# Patient Record
Sex: Female | Born: 1960 | Race: Black or African American | Hispanic: No | Marital: Single | Smoking: Never smoker
Health system: Southern US, Community
[De-identification: ages and names within clinical notes are randomized; demographics above are authoritative.]

## PROBLEM LIST (undated history)

## (undated) ENCOUNTER — Emergency Department (HOSPITAL_COMMUNITY): Payer: Self-pay | Source: Home / Self Care

## (undated) DIAGNOSIS — L309 Dermatitis, unspecified: Secondary | ICD-10-CM

## (undated) DIAGNOSIS — I10 Essential (primary) hypertension: Secondary | ICD-10-CM

## (undated) DIAGNOSIS — M1711 Unilateral primary osteoarthritis, right knee: Secondary | ICD-10-CM

## (undated) HISTORY — PX: BUNIONECTOMY: SHX129

## (undated) HISTORY — PX: CATARACT EXTRACTION: SUR2

## (undated) HISTORY — DX: Dermatitis, unspecified: L30.9

## (undated) HISTORY — DX: Unilateral primary osteoarthritis, right knee: M17.11

## (undated) HISTORY — PX: ABDOMINAL HYSTERECTOMY: SHX81

---

## 1997-07-18 ENCOUNTER — Ambulatory Visit (HOSPITAL_COMMUNITY): Admission: RE | Admit: 1997-07-18 | Discharge: 1997-07-18 | Payer: Self-pay | Admitting: Obstetrics

## 1997-07-18 ENCOUNTER — Other Ambulatory Visit: Admission: RE | Admit: 1997-07-18 | Discharge: 1997-07-18 | Payer: Self-pay | Admitting: Obstetrics

## 1997-07-19 ENCOUNTER — Other Ambulatory Visit: Admission: RE | Admit: 1997-07-19 | Discharge: 1997-07-19 | Payer: Self-pay | Admitting: Obstetrics

## 1997-08-21 ENCOUNTER — Ambulatory Visit (HOSPITAL_COMMUNITY): Admission: RE | Admit: 1997-08-21 | Discharge: 1997-08-21 | Payer: Self-pay | Admitting: Obstetrics

## 1997-10-12 ENCOUNTER — Ambulatory Visit (HOSPITAL_COMMUNITY): Admission: RE | Admit: 1997-10-12 | Discharge: 1997-10-12 | Payer: Self-pay | Admitting: Obstetrics

## 1998-01-23 ENCOUNTER — Encounter (HOSPITAL_COMMUNITY): Admission: RE | Admit: 1998-01-23 | Discharge: 1998-01-29 | Payer: Self-pay | Admitting: Obstetrics

## 1998-01-28 ENCOUNTER — Inpatient Hospital Stay (HOSPITAL_COMMUNITY): Admission: AD | Admit: 1998-01-28 | Discharge: 1998-01-31 | Payer: Self-pay | Admitting: Obstetrics

## 1998-03-18 ENCOUNTER — Emergency Department (HOSPITAL_COMMUNITY): Admission: EM | Admit: 1998-03-18 | Discharge: 1998-03-18 | Payer: Self-pay

## 1998-03-25 ENCOUNTER — Ambulatory Visit (HOSPITAL_COMMUNITY): Admission: RE | Admit: 1998-03-25 | Discharge: 1998-03-25 | Payer: Self-pay | Admitting: Obstetrics

## 2003-04-12 ENCOUNTER — Emergency Department (HOSPITAL_COMMUNITY): Admission: AD | Admit: 2003-04-12 | Discharge: 2003-04-13 | Payer: Self-pay | Admitting: Emergency Medicine

## 2003-05-15 ENCOUNTER — Other Ambulatory Visit: Admission: RE | Admit: 2003-05-15 | Discharge: 2003-05-15 | Payer: Self-pay | Admitting: Family Medicine

## 2005-07-02 ENCOUNTER — Encounter: Admission: RE | Admit: 2005-07-02 | Discharge: 2005-07-02 | Payer: Self-pay | Admitting: Family Medicine

## 2006-07-28 ENCOUNTER — Other Ambulatory Visit: Admission: RE | Admit: 2006-07-28 | Discharge: 2006-07-28 | Payer: Self-pay | Admitting: Family Medicine

## 2007-04-25 ENCOUNTER — Encounter: Admission: RE | Admit: 2007-04-25 | Discharge: 2007-04-25 | Payer: Self-pay | Admitting: Family Medicine

## 2007-08-01 ENCOUNTER — Other Ambulatory Visit: Admission: RE | Admit: 2007-08-01 | Discharge: 2007-08-01 | Payer: Self-pay | Admitting: Family Medicine

## 2008-09-07 ENCOUNTER — Other Ambulatory Visit: Admission: RE | Admit: 2008-09-07 | Discharge: 2008-09-07 | Payer: Self-pay | Admitting: Family Medicine

## 2009-01-19 HISTORY — PX: KNEE SURGERY: SHX244

## 2009-10-20 IMAGING — MG MM SCREEN MAMMOGRAM BILATERAL
4 series · 4 of 4 positions shown · non-contrast
Comparison: none

DG SCREEN MAMMOGRAM BILATERAL
Bilateral CC and MLO view(s) were taken.

DIGITAL SCREENING MAMMOGRAM WITH CAD:
There are scattered fibroglandular densities.  No masses or malignant type calcifications are 
identified.  Compared with prior studies.

[R CC]
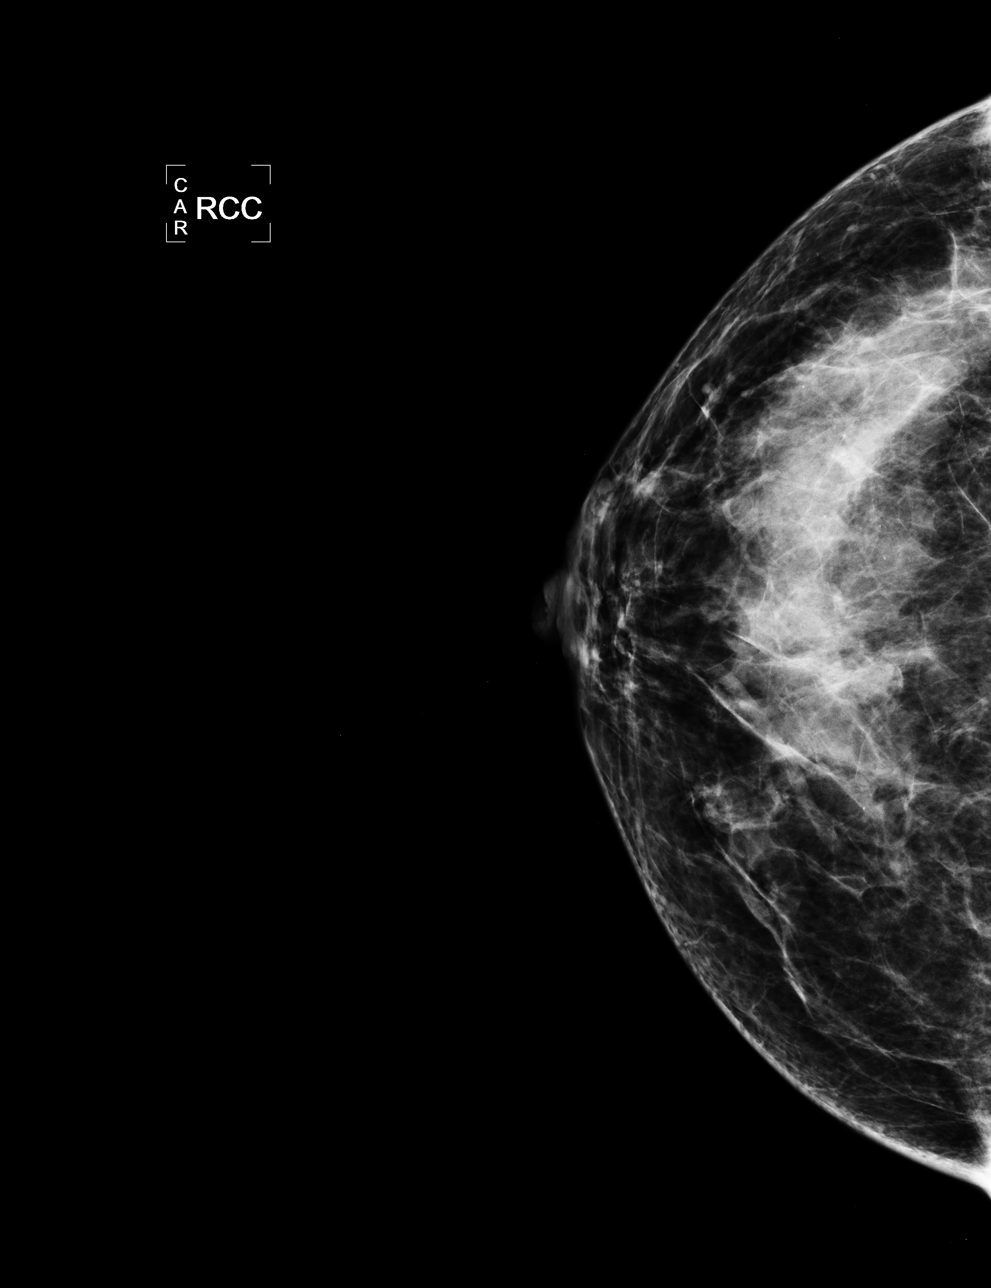

[L CC]
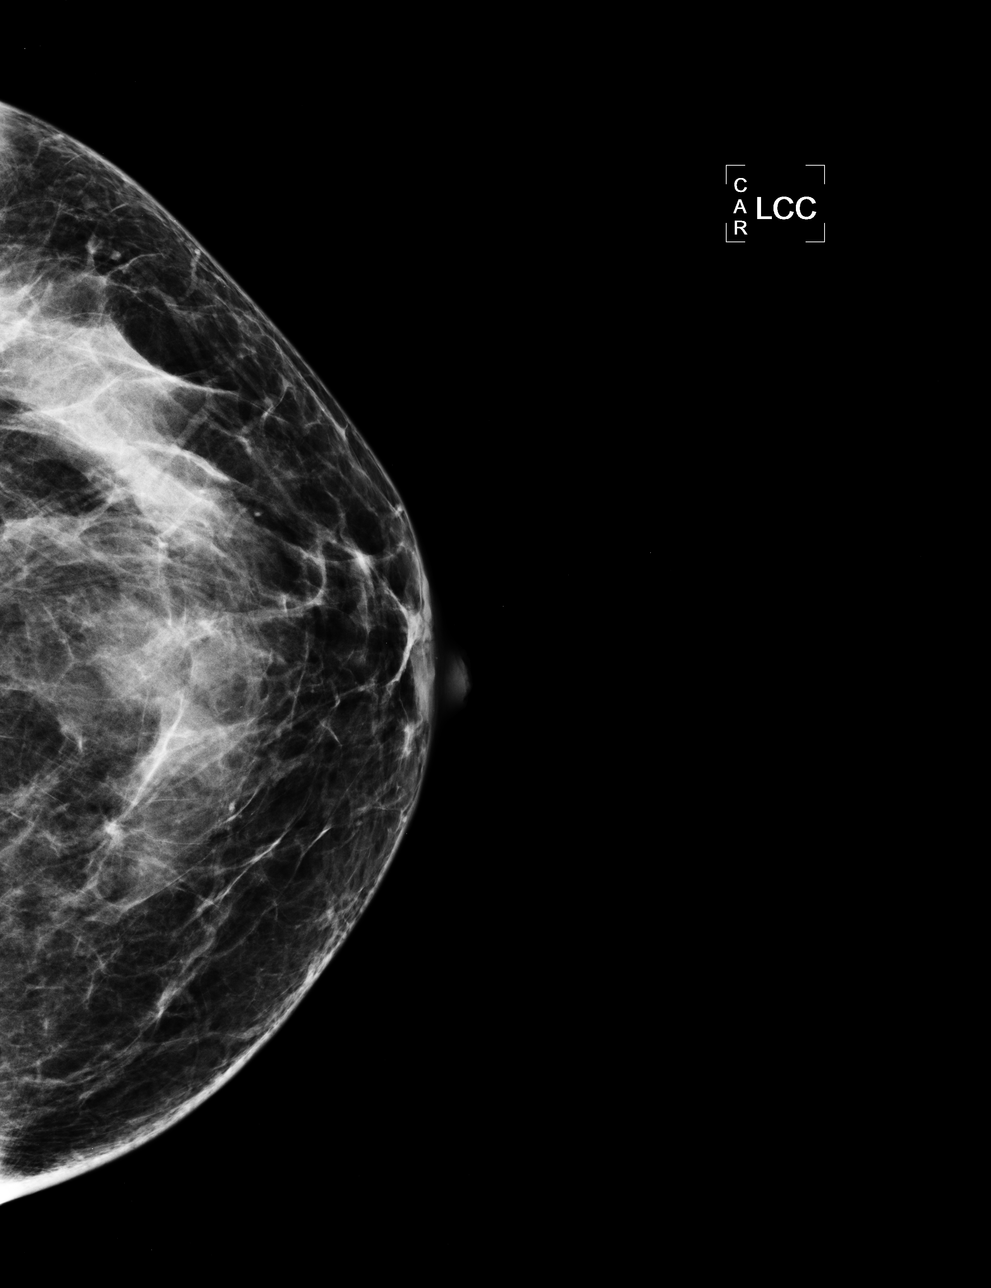

[L MLO]
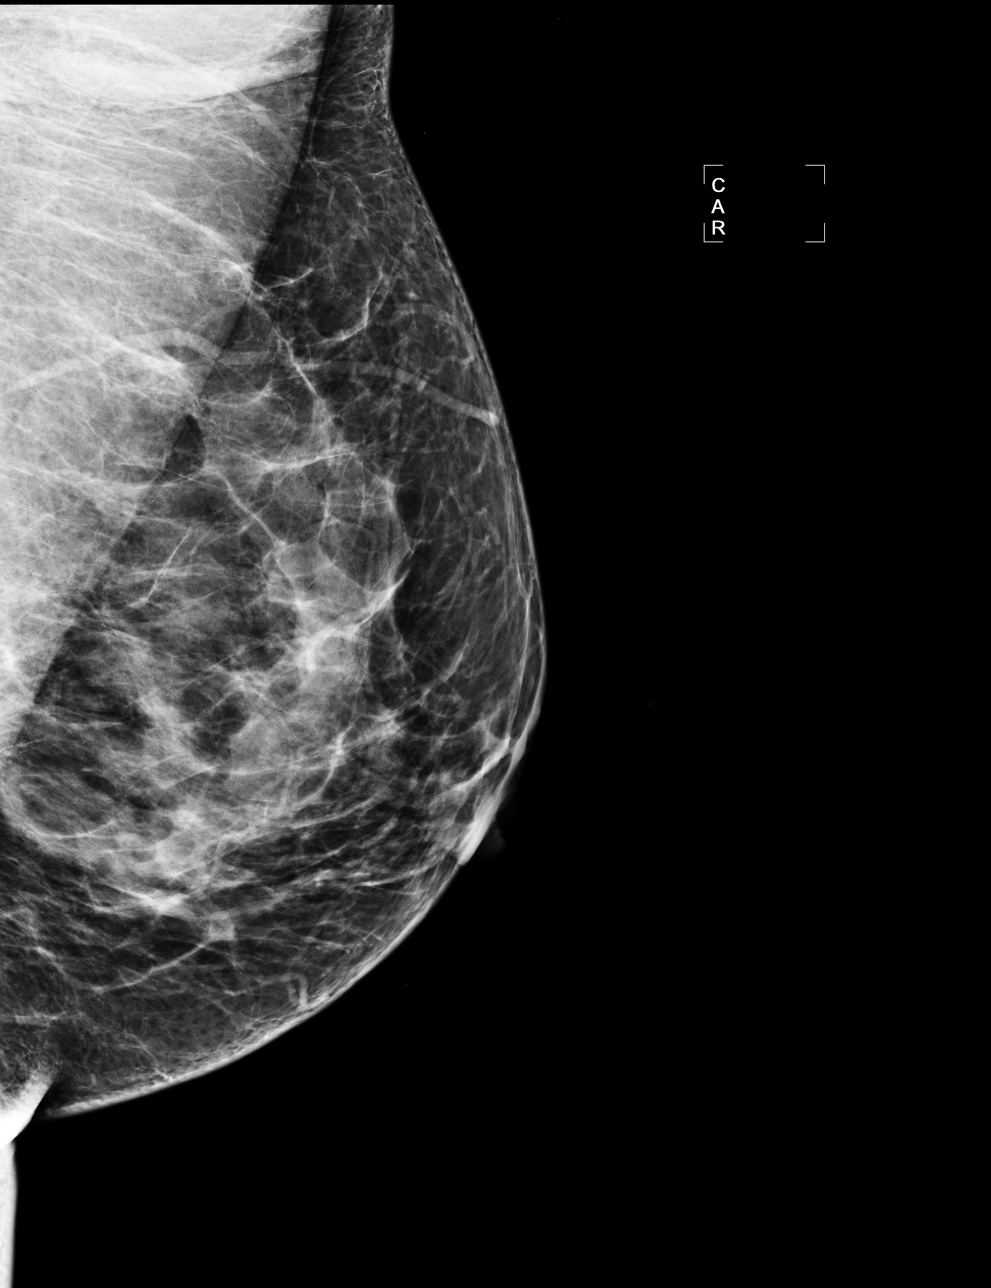

[R MLO]
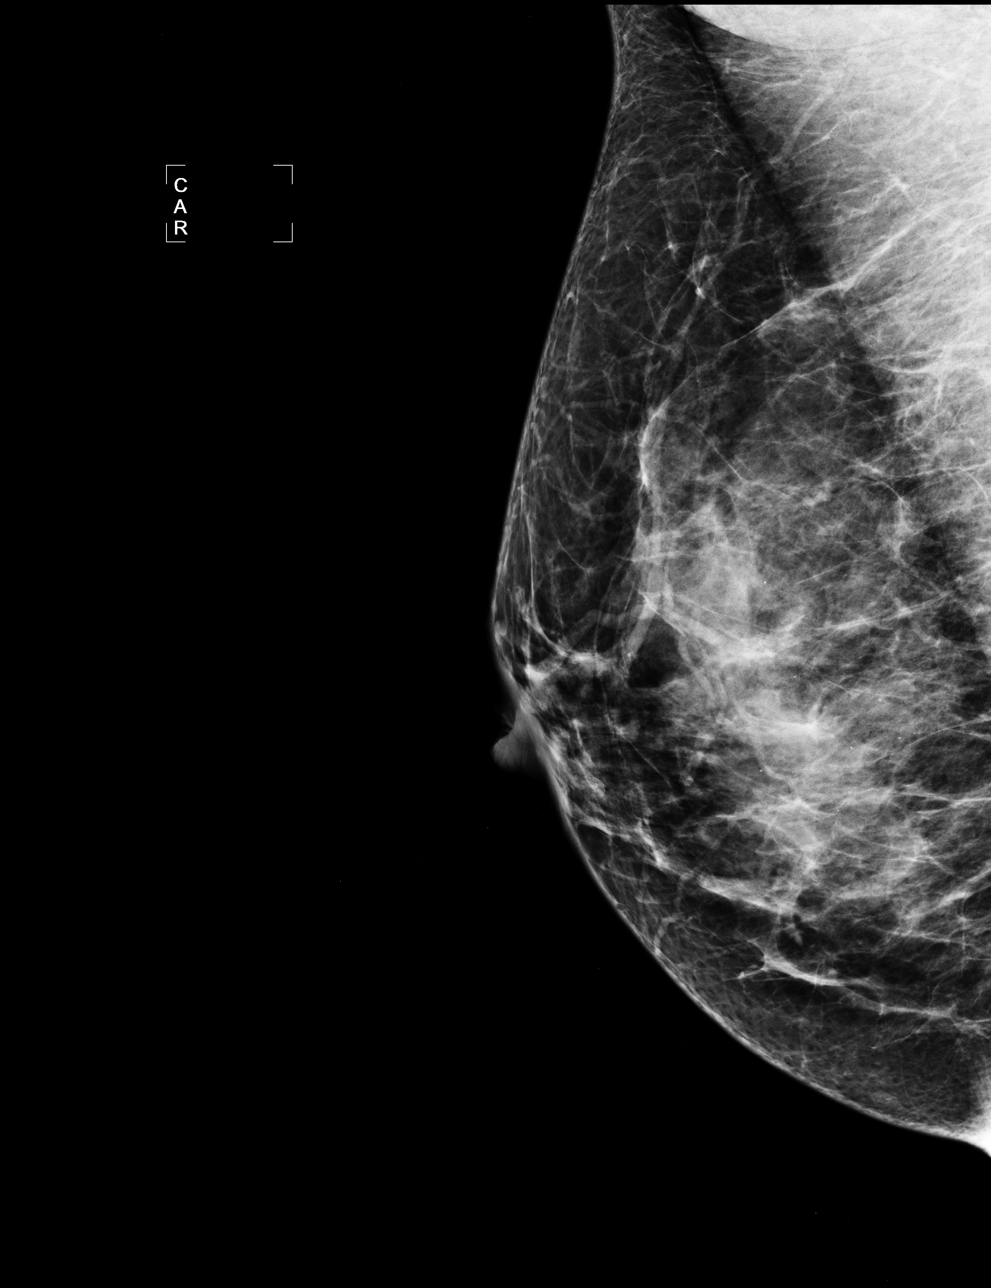

[4 of 4 positions shown; findings below may reference images not displayed]

IMPRESSION: No specific mammographic evidence of malignancy.  Next screening mammogram is recommended in one 
year.

ASSESSMENT: Negative - BI-RADS 1

Screening mammogram in 1 year.
ANALYZED BY COMPUTER AIDED DETECTION. , THIS PROCEDURE WAS A DIGITAL MAMMOGRAM.

## 2009-11-08 ENCOUNTER — Other Ambulatory Visit: Admission: RE | Admit: 2009-11-08 | Discharge: 2009-11-08 | Payer: Self-pay | Admitting: Family Medicine

## 2010-02-09 ENCOUNTER — Encounter: Payer: Self-pay | Admitting: Family Medicine

## 2010-11-10 ENCOUNTER — Other Ambulatory Visit: Payer: Self-pay | Admitting: Family Medicine

## 2010-11-10 ENCOUNTER — Other Ambulatory Visit (HOSPITAL_COMMUNITY)
Admission: RE | Admit: 2010-11-10 | Discharge: 2010-11-10 | Disposition: A | Discharge status: Home / Self Care | Payer: 59 | Source: Ambulatory Visit | Attending: Family Medicine | Admitting: Family Medicine

## 2010-11-10 DIAGNOSIS — Z01419 Encounter for gynecological examination (general) (routine) without abnormal findings: Secondary | ICD-10-CM | POA: Insufficient documentation

## 2010-12-18 ENCOUNTER — Other Ambulatory Visit: Payer: Self-pay | Admitting: Nurse Practitioner

## 2011-02-25 ENCOUNTER — Encounter (HOSPITAL_COMMUNITY): Admission: RE | Payer: Self-pay | Source: Ambulatory Visit

## 2011-02-25 ENCOUNTER — Inpatient Hospital Stay (HOSPITAL_COMMUNITY): Admission: RE | Admit: 2011-02-25 | Payer: 59 | Source: Ambulatory Visit | Admitting: Obstetrics and Gynecology

## 2011-02-25 ENCOUNTER — Encounter (HOSPITAL_COMMUNITY): Payer: Self-pay | Admitting: Pharmacist

## 2011-02-25 SURGERY — DILATATION & CURETTAGE/HYSTEROSCOPY WITH HYDROTHERMAL ABLATION
Anesthesia: Choice

## 2011-03-02 ENCOUNTER — Encounter (HOSPITAL_COMMUNITY): Payer: Self-pay

## 2011-03-02 ENCOUNTER — Encounter (HOSPITAL_COMMUNITY)
Admission: RE | Admit: 2011-03-02 | Discharge: 2011-03-02 | Disposition: A | Discharge status: Home / Self Care | Payer: 59 | Source: Ambulatory Visit | Attending: Obstetrics and Gynecology | Admitting: Obstetrics and Gynecology

## 2011-03-02 HISTORY — DX: Essential (primary) hypertension: I10

## 2011-03-02 LAB — BASIC METABOLIC PANEL
BUN: 24 mg/dL — ABNORMAL HIGH (ref 6–23)
Chloride: 103 mEq/L (ref 96–112)
Creatinine, Ser: 0.79 mg/dL (ref 0.50–1.10)
GFR calc non Af Amer: >90 mL/min (ref >90)
Glucose, Bld: 102 mg/dL — ABNORMAL HIGH (ref 70–99)
Potassium: 3.5 mEq/L (ref 3.5–5.1)

## 2011-03-02 LAB — CBC
MCH: 29.6 pg (ref 26.0–34.0)
MCHC: 33.2 g/dL (ref 30.0–36.0)
MCV: 89.2 fL (ref 78.0–100.0)
Platelets: 181 10*3/uL (ref 150–400)
RBC: 4.15 MIL/uL (ref 3.87–5.11)

## 2011-03-02 NOTE — Patient Instructions (Addendum)
20 Alexandra Moyer  03/02/2011   Your procedure is scheduled on:  03/10/11  Enter through the Main Entrance of [facility] at 10 AM.  Pick up the phone at the desk and dial [phone].   Call this number if you have problems the morning of surgery: [phone]   Remember:   Do not eat food:After Midnight.  Do not drink clear liquids: After Midnight.  Take these medicines the morning of surgery with A SIP OF WATER: NA   Do not wear jewelry, make-up or nail polish.  Do not wear lotions, powders, or perfumes. You may wear deodorant.  Do not shave 48 hours prior to surgery.  Do not bring valuables to the hospital.  Contacts, dentures or bridgework may not be worn into surgery.  Leave suitcase in the car. After surgery it may be brought to your room.  For patients admitted to the hospital, checkout time is 11:00 AM the day of discharge.   Patients discharged the day of surgery will not be allowed to drive home.  Name and phone number of your driver: NA  Special Instructions: CHG Shower Use Special Wash: 1/2 bottle night before surgery and 1/2 bottle morning of surgery.   Please read over the following fact sheets that you were given: MRSA Information

## 2011-03-09 MED ORDER — DEXTROSE 5 % IV SOLN
2.0000 g | INTRAVENOUS | Status: AC
  Administered 2011-03-10: 2 g via INTRAVENOUS
  Filled 2011-03-09: qty 2

## 2011-03-09 NOTE — Discharge Instructions (Signed)
Laparoscopic Hysterectomy  Laparoscopic surgery is an alternative to open surgery. A laparoscopic hysterectomy is a procedure to remove your womb (uterus). This procedure has a shorter recovery time, less discomfort, and is less expensive than an open operation. Laparoscopic surgery allows you to return to normal activities and recover faster.  AFTER YOUR SURGERY  After the procedure, the gas is released from inside your abdomen. Your incisions are closed. Because these incisions are small (usually less than one-half inch), there is usually minimal discomfort following the procedure.   You will be taken to the recovery area where a nurse will watch and check your progress. Once you are awake, stable, and taking fluids well, barring other problems, you will be returned to your room or allowed to go home.   You will have some mild discomfort in the throat. This is from the tube placed in your throat while you were sleeping.   Do not drink alcohol, drive a car, use public transportation or sign important papers for at least 1 to 2 days following surgery.   Try to have someone with you the first 3 to 5 days after you go home.    HOME CARE INSTRUCTIONS  Healing will take time. You will have discomfort, tenderness, swelling and bruising at the operative site for a couple of weeks. This is normal and will get better as time goes on.   Only take over-the-counter or prescription medicines for pain, discomfort or fever as directed by your caregiver.   Do not take aspirin. It can cause bleeding.   Do not drive when taking pain medication.   Follow your caregiver's advice regarding diet, exercise, lifting, driving and general activities.   Resume your usual diet as directed and allowed.   Get plenty of rest and sleep.   Do not douche, use tampons, or have sexual intercourse until your caregiver gives you permission.   Change your bandages (dressings) as directed.   Take your temperature  twice a day. Write it down.   Your caregiver may recommend showers instead of baths for a few weeks.   Do not drink alcohol until your caregiver gives you permission.   If you develop constipation, you may take a mild laxative with your caregiver's permission. Bran foods and drinking fluids helps with constipation problems.   Try to have someone home with you for a week or two to help with the household activities.   Make sure you and your family understands everything about your operation and recovery.   Do not sign any legal documents until you feel normal again.   Keep all your follow-up appointments as recommended by your caregiver.    SEEK MEDICAL CARE IF:  There is swelling, redness or increasing pain in the wound area.   Pus is coming from the wound.   You notice a bad smell from the wound or surgical dressing.   You have pain, redness and swelling from the intravenous site.   The wound is breaking open (the edges are not staying together).   You feel dizzy or feel like fainting.   You develop pain or bleeding when you urinate.   You develop diarrhea.   You develop nausea and vomiting.   You develop abnormal vaginal discharge.   You develop a rash.   You have any type of abnormal reaction or develop an allergy to your medication   SEEK IMMEDIATE MEDICAL CARE:  You develop a temperature of 100.5 or higher.   You develop abdominal   pain.   You develop chest pain.   You develop shortness of breath.   You pass out.   You develop pain, swelling or redness of your leg.   You develop heavy vaginal bleeding with or without blood clots.    

## 2011-03-09 NOTE — H&P (Signed)
Alexandra Moyer is an 51 y.o. female.   Chief Complaint: SBF G2P2 with menorrhagia impairing her activities of daily living.  PUS shows a 12 cm uterus with 4 fibroids, the largest two are 6 cm and 5 cm.  Adnexa are normal.  Endo bx was benign.   HPI: as above  Past Medical History  Diagnosis Date  . Hypertension     Past Surgical History  Procedure Date  . Knee surgery 2011    left  BTSP 2000 Bilateral bunion removal   No family history on file. Social History:  reports that she has never smoked. She does not have any smokeless tobacco history on file. She reports that she drinks alcohol. 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    Allergies: No Known Allergies  Medications Prior to Admission  Medication Dose Route Frequency Provider Last Rate Last Dose  . cefoTEtan (CEFOTAN) 2 g in dextrose 5 % 50 mL IVPB  2 g Intravenous On Call to OR Alison Murray, MD       Medications Prior to Admission  Medication Sig Dispense Refill  . lisinopril-hydrochlorothiazide (PRINZIDE,ZESTORETIC) 20-25 MG per tablet Take 1 tablet by mouth at bedtime.      . naproxen sodium (ANAPROX) 220 MG tablet Take 660 mg by mouth as needed. For Knee Pain.        No results found for this or any previous visit (from the past 48 hour(s)). No results found.  Review of Systems  All other systems reviewed and are negative.    There were no vitals taken for this visit. Physical Exam  Constitutional: She is oriented to person, place, and time. She appears well-developed and well-nourished.  HENT:  Head: Normocephalic and atraumatic.  Eyes: Conjunctivae are normal.  Neck: Normal range of motion. No tracheal deviation present. No thyromegaly present.  Cardiovascular: Normal rate, regular rhythm and normal heart sounds.   Respiratory: Effort normal and breath sounds normal.  GI: Soft. Bowel sounds are normal.    Genitourinary:       Uterus 12-14 week size and fibroid off L corpus.  Adnexa not palpable separately.  Musculoskeletal: Normal range of motion.  Neurological: She is alert and oriented to person, place, and time.  Skin: Skin is warm and dry.  Psychiatric: She has a normal mood and affect.     Assessment/Plan Robotic assisted total laparoscopic hysterectomy with bilateral salpingectomy.  Jamilett Ferrante P 03/09/2011, 2:03 PM

## 2011-03-10 ENCOUNTER — Ambulatory Visit (HOSPITAL_COMMUNITY): Payer: 59 | Admitting: Anesthesiology

## 2011-03-10 ENCOUNTER — Ambulatory Visit (HOSPITAL_COMMUNITY)
Admission: RE | Admit: 2011-03-10 | Discharge: 2011-03-10 | Disposition: A | Discharge status: Home / Self Care | Payer: 59 | Source: Ambulatory Visit | Attending: Obstetrics and Gynecology | Admitting: Obstetrics and Gynecology

## 2011-03-10 ENCOUNTER — Encounter (HOSPITAL_COMMUNITY)
Admission: RE | Disposition: A | Discharge status: Home / Self Care | Payer: Self-pay | Source: Ambulatory Visit | Attending: Obstetrics and Gynecology

## 2011-03-10 ENCOUNTER — Encounter (HOSPITAL_COMMUNITY): Payer: Self-pay | Admitting: Anesthesiology

## 2011-03-10 ENCOUNTER — Other Ambulatory Visit: Payer: Self-pay | Admitting: Obstetrics and Gynecology

## 2011-03-10 DIAGNOSIS — N920 Excessive and frequent menstruation with regular cycle: Secondary | ICD-10-CM | POA: Insufficient documentation

## 2011-03-10 DIAGNOSIS — D259 Leiomyoma of uterus, unspecified: Secondary | ICD-10-CM | POA: Insufficient documentation

## 2011-03-10 DIAGNOSIS — Z01812 Encounter for preprocedural laboratory examination: Secondary | ICD-10-CM | POA: Insufficient documentation

## 2011-03-10 DIAGNOSIS — I10 Essential (primary) hypertension: Secondary | ICD-10-CM | POA: Insufficient documentation

## 2011-03-10 HISTORY — PX: CYSTOSCOPY: SHX5120

## 2011-03-10 HISTORY — PX: ROBOTIC ASSISTED TOTAL HYSTERECTOMY WITH BILATERAL SALPINGO OOPHERECTOMY: SHX6086

## 2011-03-10 SURGERY — ROBOTIC ASSISTED TOTAL HYSTERECTOMY
Anesthesia: General | Site: Bladder | Wound class: Clean Contaminated

## 2011-03-10 MED ORDER — LACTATED RINGERS IR SOLN
Status: DC | PRN
  Administered 2011-03-10: 3000 mL

## 2011-03-10 MED ORDER — LACTATED RINGERS IV SOLN
INTRAVENOUS | Status: DC
  Administered 2011-03-10: 50 mL/h via INTRAVENOUS
  Administered 2011-03-10 (×2): via INTRAVENOUS

## 2011-03-10 MED ORDER — LIDOCAINE HCL (CARDIAC) 20 MG/ML IV SOLN
INTRAVENOUS | Status: DC | PRN
  Administered 2011-03-10: 50 mg via INTRAVENOUS

## 2011-03-10 MED ORDER — HYDROMORPHONE HCL PF 1 MG/ML IJ SOLN
INTRAMUSCULAR | Status: DC | PRN
  Administered 2011-03-10: 1 mg via INTRAVENOUS

## 2011-03-10 MED ORDER — PROMETHAZINE HCL 25 MG/ML IJ SOLN: 12.5000 mg | INTRAMUSCULAR | Status: DC | PRN

## 2011-03-10 MED ORDER — MIDAZOLAM HCL 5 MG/5ML IJ SOLN
INTRAMUSCULAR | Status: DC | PRN
  Administered 2011-03-10: 2 mg via INTRAVENOUS

## 2011-03-10 MED ORDER — FENTANYL CITRATE 0.05 MG/ML IJ SOLN
INTRAMUSCULAR | Status: DC | PRN
  Administered 2011-03-10 (×3): 100 ug via INTRAVENOUS
  Administered 2011-03-10: 50 ug via INTRAVENOUS
  Administered 2011-03-10: 100 ug via INTRAVENOUS
  Administered 2011-03-10: 50 ug via INTRAVENOUS

## 2011-03-10 MED ORDER — NEOSTIGMINE METHYLSULFATE 1 MG/ML IJ SOLN
INTRAMUSCULAR | Status: DC | PRN
  Administered 2011-03-10: 5 mg via INTRAVENOUS

## 2011-03-10 MED ORDER — FLUMAZENIL 0.5 MG/5ML IV SOLN
INTRAVENOUS | Status: DC | PRN
  Administered 2011-03-10: 0.2 mg via INTRAVENOUS

## 2011-03-10 MED ORDER — DEXAMETHASONE SODIUM PHOSPHATE 10 MG/ML IJ SOLN
INTRAMUSCULAR | Status: AC
  Filled 2011-03-10: qty 1

## 2011-03-10 MED ORDER — ROPIVACAINE HCL 5 MG/ML IJ SOLN
INTRAMUSCULAR | Status: AC
  Filled 2011-03-10: qty 60

## 2011-03-10 MED ORDER — PROPOFOL 10 MG/ML IV EMUL
INTRAVENOUS | Status: AC
  Filled 2011-03-10: qty 20

## 2011-03-10 MED ORDER — PROPOFOL 10 MG/ML IV EMUL
INTRAVENOUS | Status: DC | PRN
  Administered 2011-03-10: 200 mg via INTRAVENOUS

## 2011-03-10 MED ORDER — DEXAMETHASONE SODIUM PHOSPHATE 4 MG/ML IJ SOLN
INTRAMUSCULAR | Status: DC | PRN
  Administered 2011-03-10: 10 mg via INTRAVENOUS

## 2011-03-10 MED ORDER — OXYCODONE-ACETAMINOPHEN 5-325 MG PO TABS
1.0000 | ORAL_TABLET | ORAL | Status: DC | PRN
  Administered 2011-03-10: 1 via ORAL
  Filled 2011-03-10: qty 1

## 2011-03-10 MED ORDER — STERILE WATER FOR IRRIGATION IR SOLN
Status: DC | PRN
  Administered 2011-03-10: 1000 mL via INTRAVESICAL

## 2011-03-10 MED ORDER — INDIGOTINDISULFONATE SODIUM 8 MG/ML IJ SOLN
INTRAMUSCULAR | Status: DC | PRN
  Administered 2011-03-10: 40 mg via INTRAVENOUS

## 2011-03-10 MED ORDER — PHENYLEPHRINE HCL 10 MG/ML IJ SOLN
INTRAMUSCULAR | Status: DC | PRN
  Administered 2011-03-10: .4 mg via INTRAVENOUS
  Administered 2011-03-10 (×3): .8 mg via INTRAVENOUS
  Administered 2011-03-10: .4 mg via INTRAVENOUS

## 2011-03-10 MED ORDER — MEPERIDINE HCL 25 MG/ML IJ SOLN: 6.2500 mg | INTRAMUSCULAR | Status: DC | PRN

## 2011-03-10 MED ORDER — ONDANSETRON HCL 4 MG/2ML IJ SOLN
INTRAMUSCULAR | Status: AC
  Filled 2011-03-10: qty 2

## 2011-03-10 MED ORDER — MIDAZOLAM HCL 2 MG/2ML IJ SOLN
INTRAMUSCULAR | Status: AC
  Filled 2011-03-10: qty 2

## 2011-03-10 MED ORDER — FENTANYL CITRATE 0.05 MG/ML IJ SOLN
INTRAMUSCULAR | Status: AC
  Filled 2011-03-10: qty 2

## 2011-03-10 MED ORDER — PHENYLEPHRINE 40 MCG/ML (10ML) SYRINGE FOR IV PUSH (FOR BLOOD PRESSURE SUPPORT)
PREFILLED_SYRINGE | INTRAVENOUS | Status: AC
  Filled 2011-03-10: qty 5

## 2011-03-10 MED ORDER — FENTANYL CITRATE 0.05 MG/ML IJ SOLN
25.0000 ug | INTRAMUSCULAR | Status: DC | PRN
  Administered 2011-03-10: 50 ug via INTRAVENOUS

## 2011-03-10 MED ORDER — GLYCOPYRROLATE 0.2 MG/ML IJ SOLN
INTRAMUSCULAR | Status: DC | PRN
  Administered 2011-03-10: .4 mg via INTRAVENOUS
  Administered 2011-03-10: 0.3 mg via INTRAVENOUS

## 2011-03-10 MED ORDER — NALOXONE HCL 0.4 MG/ML IJ SOLN
INTRAMUSCULAR | Status: DC | PRN
  Administered 2011-03-10: 0.1 mg via INTRAVENOUS

## 2011-03-10 MED ORDER — ROCURONIUM BROMIDE 100 MG/10ML IV SOLN
INTRAVENOUS | Status: DC | PRN
  Administered 2011-03-10: 50 mg via INTRAVENOUS
  Administered 2011-03-10: 20 mg via INTRAVENOUS
  Administered 2011-03-10: 10 mg via INTRAVENOUS
  Administered 2011-03-10: 20 mg via INTRAVENOUS

## 2011-03-10 MED ORDER — EPHEDRINE SULFATE 50 MG/ML IJ SOLN: INTRAMUSCULAR | Status: DC | PRN

## 2011-03-10 MED ORDER — LIDOCAINE HCL (CARDIAC) 20 MG/ML IV SOLN
INTRAVENOUS | Status: AC
  Filled 2011-03-10: qty 5

## 2011-03-10 MED ORDER — ROPIVACAINE HCL 5 MG/ML IJ SOLN
INTRAMUSCULAR | Status: DC | PRN
  Administered 2011-03-10: 72 mL

## 2011-03-10 MED ORDER — FENTANYL CITRATE 0.05 MG/ML IJ SOLN
INTRAMUSCULAR | Status: AC
  Filled 2011-03-10: qty 5

## 2011-03-10 MED ORDER — METOCLOPRAMIDE HCL 5 MG/ML IJ SOLN: 10.0000 mg | Freq: Once | INTRAMUSCULAR | Status: DC | PRN

## 2011-03-10 SURGICAL SUPPLY — 56 items
ADH SKN CLS APL DERMABOND .7 (GAUZE/BANDAGES/DRESSINGS) ×6
BAG URINE DRAINAGE (UROLOGICAL SUPPLIES) ×4 IMPLANT
BARRIER ADHS 3X4 INTERCEED (GAUZE/BANDAGES/DRESSINGS) ×4 IMPLANT
BRR ADH 4X3 ABS CNTRL BYND (GAUZE/BANDAGES/DRESSINGS) ×3
CABLE HIGH FREQUENCY MONO STRZ (ELECTRODE) ×4 IMPLANT
CATH FOLEY 3WAY  5CC 16FR (CATHETERS) ×1
CATH FOLEY 3WAY 5CC 16FR (CATHETERS) ×3 IMPLANT
CONT PATH 16OZ SNAP LID 3702 (MISCELLANEOUS) ×4 IMPLANT
COVER MAYO STAND STRL (DRAPES) ×4 IMPLANT
COVER TABLE BACK 60X90 (DRAPES) ×8 IMPLANT
COVER TIP SHEARS 8 DVNC (MISCELLANEOUS) ×3 IMPLANT
COVER TIP SHEARS 8MM DA VINCI (MISCELLANEOUS) ×1
DECANTER SPIKE VIAL GLASS SM (MISCELLANEOUS) ×4 IMPLANT
DERMABOND ADVANCED (GAUZE/BANDAGES/DRESSINGS) ×2
DERMABOND ADVANCED .7 DNX12 (GAUZE/BANDAGES/DRESSINGS) ×4 IMPLANT
DRAPE HUG U DISPOSABLE (DRAPE) ×4 IMPLANT
DRAPE LG THREE QUARTER DISP (DRAPES) ×8 IMPLANT
DRAPE MONITOR DA VINCI (DRAPE) ×4 IMPLANT
DRAPE WARM FLUID 44X44 (DRAPE) ×4 IMPLANT
ELECT REM PT RETURN 9FT ADLT (ELECTROSURGICAL) ×4
ELECTRODE REM PT RTRN 9FT ADLT (ELECTROSURGICAL) ×3 IMPLANT
EVACUATOR SMOKE 8.L (FILTER) ×4 IMPLANT
GLOVE BIOGEL PI IND STRL 7.0 (GLOVE) ×15 IMPLANT
GLOVE BIOGEL PI INDICATOR 7.0 (GLOVE) ×5
GLOVE ECLIPSE 6.5 STRL STRAW (GLOVE) ×20 IMPLANT
GOWN STRL REIN XL XLG (GOWN DISPOSABLE) ×28 IMPLANT
KIT ACCESSORY DA VINCI DISP (KITS) ×1
KIT ACCESSORY DVNC DISP (KITS) ×3 IMPLANT
NDL INSUFFLATION 14GA 120MM (NEEDLE) ×1 IMPLANT
NEEDLE INSUFFLATION 14GA 120MM (NEEDLE) ×4 IMPLANT
NS IRRIG 1000ML POUR BTL (IV SOLUTION) ×12 IMPLANT
OCCLUDER COLPOPNEUMO (BALLOONS) ×4 IMPLANT
PACK LAVH (CUSTOM PROCEDURE TRAY) ×4 IMPLANT
PAD PREP 24X48 CUFFED NSTRL (MISCELLANEOUS) ×8 IMPLANT
PLUG CATH AND CAP STER (CATHETERS) ×4 IMPLANT
PROTECTOR NERVE ULNAR (MISCELLANEOUS) ×8 IMPLANT
SET CYSTO W/LG BORE CLAMP LF (SET/KITS/TRAYS/PACK) ×4 IMPLANT
SET IRRIG TUBING LAPAROSCOPIC (IRRIGATION / IRRIGATOR) ×4 IMPLANT
SOLUTION ELECTROLUBE (MISCELLANEOUS) ×4 IMPLANT
SUT VIC AB 0 CT1 27 (SUTURE) ×8
SUT VIC AB 0 CT1 27XBRD ANBCTR (SUTURE) ×5 IMPLANT
SUT VICRYL 0 UR6 27IN ABS (SUTURE) ×4 IMPLANT
SUT VICRYL RAPIDE 4/0 PS 2 (SUTURE) ×8 IMPLANT
SUT VLOC 180 0 9IN  GS21 (SUTURE) ×1
SUT VLOC 180 0 9IN GS21 (SUTURE) ×2 IMPLANT
SYR 30ML LL (SYRINGE) ×4 IMPLANT
SYR 50ML LL SCALE MARK (SYRINGE) ×4 IMPLANT
TIP UTERINE 6.7X10CM GRN DISP (MISCELLANEOUS) ×4 IMPLANT
TIP UTERINE 6.7X8CM BLUE DISP (MISCELLANEOUS) ×4 IMPLANT
TOWEL OR 17X24 6PK STRL BLUE (TOWEL DISPOSABLE) ×12 IMPLANT
TROCAR DISP BLADELESS 8 DVNC (TROCAR) ×3 IMPLANT
TROCAR DISP BLADELESS 8MM (TROCAR) ×1
TROCAR KII 12X150MM BLD COR68 (ENDOMECHANICALS) ×2 IMPLANT
TROCAR XCEL NON-BLD 5MMX100MML (ENDOMECHANICALS) ×4 IMPLANT
TUBING FILTER THERMOFLATOR (ELECTROSURGICAL) ×4 IMPLANT
WARMER LAPAROSCOPE (MISCELLANEOUS) ×4 IMPLANT

## 2011-03-10 NOTE — Anesthesia Preprocedure Evaluation (Signed)
Anesthesia Evaluation  Patient identified by MRN, date of birth, ID band Patient awake    Reviewed: Allergy & Precautions, H&P , NPO status , Patient's Chart, lab work & pertinent test results  Airway Mallampati: II TM Distance: >3 FB Neck ROM: Full    Dental No notable dental hx. (+) Chipped   Pulmonary neg pulmonary ROS,  clear to auscultation  Pulmonary exam normal       Cardiovascular hypertension, Pt. on medications Regular Normal    Neuro/Psych Negative Neurological ROS  Negative Psych ROS   GI/Hepatic negative GI ROS, Neg liver ROS,   Endo/Other  Negative Endocrine ROS  Renal/GU negative Renal ROS     Musculoskeletal negative musculoskeletal ROS (+)   Abdominal (+)  Abdomen: soft.    Peds  Hematology negative hematology ROS (+)   Anesthesia Other Findings   Reproductive/Obstetrics negative OB ROS                           Anesthesia Physical Anesthesia Plan  ASA: II  Anesthesia Plan: General   Post-op Pain Management:    Induction: Intravenous  Airway Management Planned: Oral ETT  Additional Equipment:   Intra-op Plan:   Post-operative Plan: Extubation in OR  Informed Consent: I have reviewed the patients History and Physical, chart, labs and discussed the procedure including the risks, benefits and alternatives for the proposed anesthesia with the patient or authorized representative who has indicated his/her understanding and acceptance.   Dental advisory given  Plan Discussed with: CRNA, Anesthesiologist and Surgeon  Anesthesia Plan Comments:         Anesthesia Quick Evaluation

## 2011-03-10 NOTE — Progress Notes (Signed)
Pt. Alert ox4  No further n/v  Pt. D/c via w/c at 2200 inc. x5 c/d vding without diff. Po intake good with liquids

## 2011-03-10 NOTE — Op Note (Signed)
Preoperative diagnosis: Menorrhagia, uterine fibroids Postoperative diagnosis: Same, path pending Procedure: Robotic assisted total laparoscopic hysterectomy with bilateral salpingectomy Surgeon Dr. Meredeth Ide Assistant: Dr. Leda Quail Estimated blood loss: 100 cc Complications: None Procedure: The patient was taken to the operating room and after induction of adequate general endotracheal anesthesia was placed in the low dorsolithotomy position and prepped and draped in usual fashion. a posterior weighted and anterior Deaver retractor were placed the cervix was grasped on its anterior lip with a single-tooth tenaculum.  The uterus sounded to 8 cm.  An 8 cm Rumi with a small Koh ring was introduced into the uterus and the balloon inflated.  A Foley catheter was inserted.  Meanwhile the assistant had made a small nick in the umbilicus at the site of previous bilateral tubal sterilization procedure scar, and inserted the varies needle into the peritoneal space.   proper placement was tested by noting a negative aspirate then free flow of normal saline through the varies needle again with a negative aspirate and then by noting the response of a drop of saline placed at the hub of the Verres needle to negative pressure as the abdominal wall was elevated.  Pneumoperitoneum was created with 2 L of CO2 using the automatic insufflator.  A site was identified in the left upper quadrant in the midclavicular line 2 fingerbreadths below the rib margin.  The site was anesthetized with dilute ropivacaine, incised with a knife, and a 5 mm Optiview trocar was inserted into the peritoneal space.  A site was identified approximately 2 fingerbreadths above the umbilicus for the camera port.  The skin was infiltrated with dilute ropivacaine, incised with a knife, and a 12 mm bladed trocar was inserted under direct visualization.  Transillumination of the abdomen assisted in determining the sites for the robotic ports in  the right and left sides.  The sites were infiltrated with dilute ropivacaine incised with a knife and the robotic trochars were inserted under direct visualization.  The robot was then brought in and docked on the patient's left.  The PK gyrus and the monopolar scissors were inserted under direct visualization.  Inspection of the pelvis revealed the uterus to be quite large with multiple fibroids.  There was a large approximately 6 cm fibroid off to the patient's left, and another approximately 6 cm fibroid in the fundus.  The uterus was quite heavy and difficult to manipulate.  The ovaries appeared normal.  The tubes were status post sterilization procedure but were otherwise normal.  The ureter could clearly be seen on the patient's right but could not be seen on the patient's left.  The procedure began on the patient's right, cauterizing between the ovary and the fallopian tube, and coming across the cut and of the fallopian tube to allow it to be extracted through the robotic port.  The utero-ovarian ligament and round ligament were then cauterized and cut.  Anterior and posterior peritoneum were taken down sharply.  Uterine artery was skeletonized, coagulated multiple times, and cut.  This procedure was repeated on the patient's left, cauterizing between the ovary and the fallopian tube and actually freeing the fallopian tube along its entire length on the patient's left.  The utero-ovarian ligament and round ligament were then cauterized and cut.  The anterior posterior leafs of the broad ligament were taken down sharply.  The uterine artery was skeletonized coagulated multiple times, and cut.  It was obvious that this uterus was not going to be able to be  removed intact through the vagina.  Therefore a myomectomy was done on the 2 largest fibroids by incising the serosa over the fibroids, and then freeing the fibroids up almost completely from their attachments but leaving them attached of it would come out  with the specimen.  A circumferential colpotomy incision was then made with monopolar cautery.  An attempt was made to remove the uterus through the vagina, however it was still too large to come through the vagina.  The assistant did a vaginal morcellation to a point where the specimen could then be removed.  Total uterine weight was 423 g.  Robotic instruments were then changed out for a long tip forceps and a mega-suture cut needle driver.  A V. LOC suture 180 days 9 inches was used to suture the vagina closed.  Good hemostasis was noted.  The needle was parked in the right paracolic gutter for later retrieval.  Sheet of Interceed was brought into the robotic ports and placed over the vaginal cuff.  Ackles were inspected and found to be hemostatic.  Robotic instruments were removed the robot was undocked and taken away.  A 5 mm scope was then inserted through the assistant's port and the [place] forceps was introduced through the camera port and the V. LOC needle was retrieved from the paracolic gutter.  The trocar sleeves were removed under direct visualization.  The peritoneum was allowed to escape through the umbilical trocar prior to its removal.  The fascia at the super umbilical sites was closed with a single suture of 0 Vicryl.  The skin was closed with 3-0 Vicryl Rapide and Dermabond.  Patient was given IV indigo carmine and cystoscopy was then carried out.  Indigo carmine could clearly be seen coming from both ureteral orifices.  The bladder was drained with Foley catheter and then it was removed.  Vagina was wiped clean.  The procedure was terminated.  Sponge needle instrument counts were correct.  Patient was taken to the recovery room in satisfactory condition.

## 2011-03-10 NOTE — Interval H&P Note (Signed)
History and Physical Interval Note:  03/10/2011 10:09 AM  Alexandra Moyer  has presented today for surgery, with the diagnosis of fibroids  The various methods of treatment have been discussed with the patient and family. After consideration of risks, benefits and other options for treatment, the patient has consented to  Procedure(s) (LRB): ROBOTIC ASSISTED TOTAL HYSTERECTOMY (N/A) as a surgical intervention .  The patients' history has been reviewed, patient examined, no change in status, stable for surgery.  I have reviewed the patients' chart and labs.  Questions were answered to the patient's satisfaction.     Larsen Dungan P

## 2011-03-10 NOTE — Anesthesia Procedure Notes (Signed)
Procedure Name: Intubation Date/Time: 03/10/2011 11:17 AM Performed by: Madison Hickman Pre-anesthesia Checklist: Patient identified, Emergency Drugs available, Suction available, Timeout performed and Patient being monitored Patient Re-evaluated:Patient Re-evaluated prior to inductionOxygen Delivery Method: Circle System Utilized Preoxygenation: Pre-oxygenation with 100% oxygen Intubation Type: IV induction Ventilation: Mask ventilation without difficulty Laryngoscope Size: Mac and 3 Grade View: Grade II Tube size: 7.0 mm Number of attempts: 1 Airway Equipment and Method: stylet Placement Confirmation: ETT inserted through vocal cords under direct vision,  positive ETCO2,  CO2 detector and breath sounds checked- equal and bilateral Secured at: 22 cm Tube secured with: Tape Dental Injury: Teeth and Oropharynx as per pre-operative assessment

## 2011-03-10 NOTE — Transfer of Care (Signed)
Immediate Anesthesia Transfer of Care Note  Patient: Alexandra Moyer  Procedure(s) Performed: Procedure(s) (LRB): ROBOTIC ASSISTED TOTAL HYSTERECTOMY (N/A) BILATERAL SALPINGECTOMY (Bilateral) CYSTOSCOPY (N/A)  Patient Location: PACU  Anesthesia Type: General  Level of Consciousness: sedated  Airway & Oxygen Therapy: Patient connected to face mask oxygen  Post-op Assessment: Post -op Vital signs reviewed and stable and Patient moving all extremities  Post vital signs: Reviewed and stable  Complications: No apparent anesthesia complications

## 2011-03-11 ENCOUNTER — Encounter (HOSPITAL_COMMUNITY): Payer: Self-pay | Admitting: Obstetrics and Gynecology

## 2011-03-12 NOTE — Anesthesia Postprocedure Evaluation (Signed)
  Anesthesia Post-op Note  Patient: Alexandra Moyer  Procedure(s) Performed: Procedure(s) (LRB): ROBOTIC ASSISTED TOTAL HYSTERECTOMY (N/A) BILATERAL SALPINGECTOMY (Bilateral) CYSTOSCOPY (N/A) Patient is awake and responsive. Pain and nausea are reasonably well controlled. Vital signs are stable and clinically acceptable. Oxygen saturation is clinically acceptable. There are no apparent anesthetic complications at this time. Patient is ready for discharge.

## 2013-05-26 ENCOUNTER — Other Ambulatory Visit: Payer: Self-pay | Admitting: Gastroenterology

## 2016-05-05 DIAGNOSIS — Z0000 Encounter for general adult medical examination without abnormal findings: Secondary | ICD-10-CM | POA: Diagnosis not present

## 2016-05-05 DIAGNOSIS — L309 Dermatitis, unspecified: Secondary | ICD-10-CM | POA: Diagnosis not present

## 2016-05-05 DIAGNOSIS — I10 Essential (primary) hypertension: Secondary | ICD-10-CM | POA: Diagnosis not present

## 2016-10-20 DIAGNOSIS — Z23 Encounter for immunization: Secondary | ICD-10-CM | POA: Diagnosis not present

## 2017-05-10 DIAGNOSIS — I10 Essential (primary) hypertension: Secondary | ICD-10-CM | POA: Diagnosis not present

## 2017-05-10 DIAGNOSIS — M170 Bilateral primary osteoarthritis of knee: Secondary | ICD-10-CM | POA: Diagnosis not present

## 2017-05-10 DIAGNOSIS — L309 Dermatitis, unspecified: Secondary | ICD-10-CM | POA: Diagnosis not present

## 2017-07-08 DIAGNOSIS — G8929 Other chronic pain: Secondary | ICD-10-CM | POA: Diagnosis not present

## 2017-07-08 DIAGNOSIS — M1712 Unilateral primary osteoarthritis, left knee: Secondary | ICD-10-CM | POA: Diagnosis not present

## 2017-07-08 DIAGNOSIS — Z9889 Other specified postprocedural states: Secondary | ICD-10-CM | POA: Diagnosis not present

## 2017-10-30 DIAGNOSIS — Z23 Encounter for immunization: Secondary | ICD-10-CM | POA: Diagnosis not present

## 2018-02-18 DIAGNOSIS — Z1231 Encounter for screening mammogram for malignant neoplasm of breast: Secondary | ICD-10-CM | POA: Diagnosis not present

## 2018-02-18 DIAGNOSIS — Z1239 Encounter for other screening for malignant neoplasm of breast: Secondary | ICD-10-CM | POA: Diagnosis not present

## 2019-05-29 ENCOUNTER — Other Ambulatory Visit: Payer: Self-pay

## 2019-05-29 ENCOUNTER — Encounter (HOSPITAL_COMMUNITY): Payer: Self-pay

## 2019-05-29 ENCOUNTER — Ambulatory Visit (INDEPENDENT_AMBULATORY_CARE_PROVIDER_SITE_OTHER): Payer: 59

## 2019-05-29 ENCOUNTER — Ambulatory Visit (HOSPITAL_COMMUNITY)
Admission: EM | Admit: 2019-05-29 | Discharge: 2019-05-29 | Disposition: A | Discharge status: Home / Self Care | Payer: 59

## 2019-05-29 DIAGNOSIS — M79671 Pain in right foot: Secondary | ICD-10-CM

## 2019-05-29 DIAGNOSIS — S99921A Unspecified injury of right foot, initial encounter: Secondary | ICD-10-CM

## 2019-05-29 NOTE — ED Triage Notes (Signed)
Pt c/o pain to right 3rd toe after hitting toe on wall approx 4 days ago. Edema/echhymosis noted.  Denies further complaint.

## 2019-05-29 NOTE — ED Provider Notes (Signed)
[facility]    CSN: [id number] Arrival date & time: 05/29/19  1143      History   Chief Complaint Chief Complaint  Patient presents with  . Foot Injury    HPI Alexandra Moyer is a 59 y.o. female.   Patient is a 59 year old female presents today for right foot injury.  The injury is located to the third toe of the right foot.  This occurred approximate 4 days ago when she had her foot on the wall.  She has had some mild swelling and bruising.  Able to ambulate.  Reporting the pain worsened this morning when she put her shoe on to go to work. Has not done anything to treat the injury.   ROS per HPI      Past Medical History:  Diagnosis Date  . Hypertension     There are no problems to display for this patient.   Past Surgical History:  Procedure Laterality Date  . CYSTOSCOPY  03/10/2011   Procedure: CYSTOSCOPY;  Surgeon: Peri Maris, MD;  Location: [facility];  Service: Gynecology;  Laterality: N/A;  . KNEE SURGERY  2011   left    OB History   No obstetric history on file.      Home Medications    Prior to Admission medications   Medication Sig Start Date End Date Taking? Authorizing Provider  amLODipine (NORVASC) 10 MG tablet Take by mouth.   Yes [provider]  lisinopril-hydrochlorothiazide (PRINZIDE,ZESTORETIC) 20-25 MG per tablet Take 1 tablet by mouth at bedtime.   Yes [provider]  LISINOPRIL PO Take by mouth.    [provider]  naproxen sodium (ANAPROX) 220 MG tablet Take 660 mg by mouth as needed. For Knee Pain.    [provider]    Family History Family History  Problem Relation Age of Onset  . Cancer Mother   . Diabetes Mother   . Hypertension Mother   . Cancer Father   . Diabetes Father   . Hypertension Father     Social History Social History   Tobacco Use  . Smoking status: Never Smoker  . Smokeless tobacco: Never Used  Substance Use Topics  . Alcohol use: Yes    Comment: rarely    . Drug use: No     Allergies   Patient has no known allergies.   Review of Systems Review of Systems   Physical Exam Triage Vital Signs ED Triage Vitals  Enc Vitals Group     BP 05/29/19 1243 113/77     Pulse Rate 05/29/19 1243 88     Resp 05/29/19 1243 18     Temp 05/29/19 1243 97.9 F (36.6 C)     Temp src --      SpO2 05/29/19 1243 99 %     Weight --      Height --      Head Circumference --      Peak Flow --      Pain Score 05/29/19 1239 10     Pain Loc --      Pain Edu? --      Excl. in [place]? --    No data found.  Updated Vital Signs BP 113/77 (BP Location: Right Arm)   Pulse 88   Temp 97.9 F (36.6 C)   Resp 18   LMP 01/30/2011   SpO2 99%   Visual Acuity Right Eye Distance:   Left Eye Distance:   Bilateral Distance:  Right Eye Near:   Left Eye Near:    Bilateral Near:     Physical Exam Vitals and nursing note reviewed.  Constitutional:      General: She is not in acute distress.    Appearance: Normal appearance. She is not ill-appearing, toxic-appearing or diaphoretic.  HENT:     Head: Normocephalic.     Nose: Nose normal.  Eyes:     Conjunctiva/sclera: Conjunctivae normal.  Pulmonary:     Effort: Pulmonary effort is normal.  Musculoskeletal:        General: Normal range of motion.     Cervical back: Normal range of motion.       Feet:     Comments: TTP with bruising.   Skin:    General: Skin is warm and dry.     Findings: No rash.  Neurological:     Mental Status: She is alert.  Psychiatric:        Mood and Affect: Mood normal.      UC Treatments / Results  Labs (all labs ordered are listed, but only abnormal results are displayed) Labs Reviewed - No data to display  EKG   Radiology DG Foot Complete Right  Result Date: 05/29/2019 CLINICAL DATA:  Right foot pain for 4 days since the patient struck her foot on the wall. Initial encounter. EXAM: RIGHT FOOT COMPLETE - 3+ VIEW COMPARISON:  None. FINDINGS: No acute bony or  joint abnormality is identified. The patient is status post hallux valgus repair and resection arthroplasty of the head of the proximal phalanx of the second toe. 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 Calcaneal spur noted. Soft tissues unremarkable. IMPRESSION: No acute abnormality. 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 Electronically Signed   By: Inge Rise M.D.   On: 05/29/2019 13:36    Procedures Procedures (including critical care time)  Medications Ordered in UC Medications - No data to display  Initial Impression / Assessment and Plan / UC Course  I have reviewed the triage vital signs and the nursing notes.  Pertinent labs & imaging results that were available during my care of the patient were reviewed by me and considered in my medical decision making (see chart for details).     Right foot injury X-ray without any fracture. We will have her rest, ice, elevate and stay off the foot. Work note given as requested. Follow up as needed for continued or worsening symptoms  Final Clinical Impressions(s) / UC Diagnoses   Final diagnoses:  Injury of right foot, initial encounter     Discharge Instructions     Your x-ray did not show any fracture.  Continue to rest, ice and elevate the foot. Work note given to rest for a few days Follow up as needed for continued or worsening symptoms     ED Prescriptions    None     PDMP not reviewed this encounter.   Orvan July, NP 05/29/19 1455

## 2019-05-29 NOTE — Discharge Instructions (Signed)
Your x-ray did not show any fracture.  Continue to rest, ice and elevate the foot. Work note given to rest for a few days Follow up as needed for continued or worsening symptoms

## 2021-06-26 ENCOUNTER — Encounter: Payer: Self-pay | Admitting: Nurse Practitioner

## 2021-06-26 ENCOUNTER — Ambulatory Visit (INDEPENDENT_AMBULATORY_CARE_PROVIDER_SITE_OTHER): Payer: 59 | Admitting: Nurse Practitioner

## 2021-06-26 VITALS — BP 132/82 | HR 93 | Temp 96.6°F | Ht 68.0 in | Wt 204.6 lb

## 2021-06-26 DIAGNOSIS — L309 Dermatitis, unspecified: Secondary | ICD-10-CM | POA: Diagnosis not present

## 2021-06-26 DIAGNOSIS — M25562 Pain in left knee: Secondary | ICD-10-CM

## 2021-06-26 DIAGNOSIS — G8929 Other chronic pain: Secondary | ICD-10-CM | POA: Diagnosis not present

## 2021-06-26 DIAGNOSIS — I10 Essential (primary) hypertension: Secondary | ICD-10-CM | POA: Insufficient documentation

## 2021-06-26 MED ORDER — MELOXICAM 15 MG PO TABS: 15.0000 mg | ORAL_TABLET | Freq: Every day | ORAL | 0 refills | Status: DC

## 2021-06-26 MED ORDER — TRIAMCINOLONE ACETONIDE 0.1 % EX OINT: 1.0000 "application " | TOPICAL_OINTMENT | Freq: Two times a day (BID) | CUTANEOUS | 0 refills | Status: AC

## 2021-06-26 NOTE — Patient Instructions (Signed)
It was great to see you!  Start meloxicam 1 tablet daily for your knee pain. Stop the naproxen and if you need anything else, you can take tylenol as needed.   Let's follow-up in 4 weeks, sooner if you have concerns.  If a referral was placed today, you will be contacted for an appointment. Please note that routine referrals can sometimes take up to 3-4 weeks to process. Please call our office if you haven't heard anything after this time frame.  Take care,  Vance Peper, NP

## 2021-06-26 NOTE — Progress Notes (Signed)
New Patient Office Visit  Subjective    Patient ID: Alexandra Moyer, female    DOB: 1960-11-03  Age: 61 y.o. MRN: [id number]  CC:  Chief Complaint  Patient presents with   Establish Care    Np. Est care. No main concerns.     HPI Alexandra Moyer presents for new patient visit to establish care.  Introduced to Designer, jewellery role and practice setting.  All questions answered.  Discussed provider/patient relationship and expectations.  She has a history of elevated blood pressure. She takes amlodipine '10mg'$  daily and lisinopril-hctz 20-'25mg'$  daily. She denies chest pain and shortness of breath. She does have some light headaches which occurs sometimes if she is hungry. She takes her blood pressure at home and it is good.   She has chronic left knee pain and swelling. She had surgery on this knee in the past and was seeing orthopedics. She was offered a knee injection however, she doesn't like needles. Sitting for long periods of time makes it stiff. She was taking naproxen, but feels like this is not helping.   She also has a history of eczema and uses triamcinolone ointment as needed. She states that sun makes it worse. She needs a refill of this medication.   Depression and Anxiety Screen done:     06/26/2021    3:40 PM  Depression screen PHQ 2/9  Decreased Interest 0  Down, Depressed, Hopeless 0  PHQ - 2 Score 0  Altered sleeping 1  Tired, decreased energy 1  Feeling bad or failure about yourself  0  Trouble concentrating 0  Moving slowly or fidgety/restless 0  Suicidal thoughts 0  PHQ-9 Score 2      06/26/2021    3:40 PM  GAD 7 : Generalized Anxiety Score  Nervous, Anxious, on Edge 0  Control/stop worrying 1  Worry too much - different things 1  Trouble relaxing 0  Restless 0  Easily annoyed or irritable 0  Afraid - awful might happen 0  Total GAD 7 Score 2    Outpatient Encounter Medications as of 06/26/2021  Medication Sig   lisinopril-hydrochlorothiazide  (ZESTORETIC) 20-25 MG tablet Take 1 tablet by mouth daily.   meloxicam (MOBIC) 15 MG tablet Take 1 tablet (15 mg total) by mouth daily.   triamcinolone ointment (KENALOG) 0.1 % Apply 1 application  topically 2 (two) times daily.   amLODipine (NORVASC) 10 MG tablet Take by mouth.   [DISCONTINUED] LISINOPRIL PO Take by mouth.   [DISCONTINUED] lisinopril-hydrochlorothiazide (PRINZIDE,ZESTORETIC) 20-25 MG per tablet Take 1 tablet by mouth at bedtime.   [DISCONTINUED] naproxen sodium (ANAPROX) 220 MG tablet Take 660 mg by mouth as needed. For Knee Pain.   No facility-administered encounter medications on file as of 06/26/2021.    Past Medical History:  Diagnosis Date   Eczema    Hypertension     Past Surgical History:  Procedure Laterality Date   ABDOMINAL HYSTERECTOMY     partial   BUNIONECTOMY Bilateral    CATARACT EXTRACTION Right    CYSTOSCOPY  03/10/2011   Procedure: CYSTOSCOPY;  Surgeon: Peri Maris, MD;  Location: [facility];  Service: Gynecology;  Laterality: N/A;   KNEE SURGERY  01/19/2009   left    Family History  Problem Relation Age of Onset   Cancer Mother        breast   Diabetes Mother    Hypertension Mother    Cancer Father        prostate  Diabetes Father    Hypertension Father    Heart disease Sister     Social History   Socioeconomic History   Marital status: Single    Spouse name: Not on file   Number of children: Not on file   Years of education: Not on file   Highest education level: Not on file  Occupational History   Not on file  Tobacco Use   Smoking status: Never   Smokeless tobacco: Never  Vaping Use   Vaping Use: Never used  Substance and Sexual Activity   Alcohol use: Yes    Comment: rarely   Drug use: No   Sexual activity: Not Currently    Birth control/protection: Surgical  Other Topics Concern   Not on file  Social History Narrative   Not on file   Social Determinants of Health   Financial Resource Strain: Not on file   Food Insecurity: Not on file  Transportation Needs: Not on file  Physical Activity: Not on file  Stress: Not on file  Social Connections: Not on file  Intimate Partner Violence: Not on file    Review of Systems  Constitutional:  Positive for malaise/fatigue (sometimes in the morning).  HENT: Negative.    Eyes: Negative.   Respiratory: Negative.    Cardiovascular: Negative.   Gastrointestinal:  Positive for constipation (at times). Negative for abdominal pain, diarrhea, nausea and vomiting.  Genitourinary:  Positive for frequency. Negative for dysuria, hematuria and urgency.  Musculoskeletal: Negative.   Skin:  Positive for rash (history of eczema).  Neurological: Negative.   Psychiatric/Behavioral: Negative.       Objective    BP 132/82 (BP Location: Right Arm, Cuff Size: Normal)   Pulse 93   Temp (!) 96.6 F (35.9 C) (Temporal)   Ht '5\' 8"'$  (1.727 m)   Wt 204 lb 9.6 oz (92.8 kg)   LMP 01/30/2011   SpO2 97%   BMI 31.11 kg/m   Physical Exam Vitals and nursing note reviewed.  Constitutional:      General: She is not in acute distress.    Appearance: Normal appearance.  HENT:     Head: Normocephalic and atraumatic.     Right Ear: Tympanic membrane, ear canal and external ear normal.     Left Ear: Tympanic membrane, ear canal and external ear normal.  Eyes:     Conjunctiva/sclera: Conjunctivae normal.  Cardiovascular:     Rate and Rhythm: Normal rate and regular rhythm.     Pulses: Normal pulses.     Heart sounds: Normal heart sounds.  Pulmonary:     Effort: Pulmonary effort is normal.     Breath sounds: Normal breath sounds.  Abdominal:     Palpations: Abdomen is soft.     Tenderness: There is no abdominal tenderness.  Musculoskeletal:        General: Normal range of motion.     Cervical back: Normal range of motion.  Skin:    General: Skin is warm and dry.  Neurological:     General: No focal deficit present.     Mental Status: She is alert and oriented to  person, place, and time.  Psychiatric:        Mood and Affect: Mood normal.        Behavior: Behavior normal.        Thought Content: Thought content normal.        Judgment: Judgment normal.      Assessment & Plan:   Problem  List Items Addressed This Visit       Cardiovascular and Mediastinum   Primary hypertension - Primary    Chronic, stable.  BP 132/82 today.  We will have her continue amlodipine 10 mg daily and lisinopril-HCTZ 20-25 mg daily.  We are requesting records from prior PCP.  Follow-up in 4 weeks for physical      Relevant Medications   lisinopril-hydrochlorothiazide (ZESTORETIC) 20-25 MG tablet     Musculoskeletal and Integument   Eczema    Chronic, stable.  She uses the triamcinolone ointment as needed for her eczema.  She states that usually gets worse in the summer with the sun.  Refill sent to the pharmacy.        Other   Chronic pain of left knee    She has chronic pain of her left knee with swelling.  She has had surgery on this in the past.  She was following with orthopedics and was offered an injection, however she does not like needles and so she declined this.  She was taking naproxen which does not help.  We will start meloxicam 15 mg daily.  Instructed her that she can take Tylenol as needed for pain as well.  Follow-up in 4 weeks.      Relevant Medications   meloxicam (MOBIC) 15 MG tablet    Return in about 4 weeks (around 07/24/2021) for CPE.   Charyl Dancer, NP

## 2021-06-26 NOTE — Assessment & Plan Note (Addendum)
She has chronic pain of her left knee with swelling.  She has had surgery on this in the past.  She was following with orthopedics and was offered an injection, however she does not like needles and so she declined this.  She was taking naproxen which does not help.  We will start meloxicam 15 mg daily.  Instructed her that she can take Tylenol as needed for pain as well.  Follow-up in 4 weeks.

## 2021-06-26 NOTE — Assessment & Plan Note (Signed)
Chronic, stable.  BP 132/82 today.  We will have her continue amlodipine 10 mg daily and lisinopril-HCTZ 20-25 mg daily.  We are requesting records from prior PCP.  Follow-up in 4 weeks for physical

## 2021-06-26 NOTE — Assessment & Plan Note (Signed)
Chronic, stable.  She uses the triamcinolone ointment as needed for her eczema.  She states that usually gets worse in the summer with the sun.  Refill sent to the pharmacy.

## 2021-07-04 LAB — HM MAMMOGRAPHY

## 2021-07-24 ENCOUNTER — Ambulatory Visit: Payer: 59 | Admitting: Nurse Practitioner

## 2021-11-23 IMAGING — DX DG FOOT COMPLETE 3+V*R*
3 series · 3 of 3 positions shown · non-contrast
Comparison: None.

CLINICAL DATA: Right foot pain for 4 days since the patient struck
her foot on the wall. Initial encounter.

EXAM:
RIGHT FOOT COMPLETE - 3+ VIEW

[foot ap]
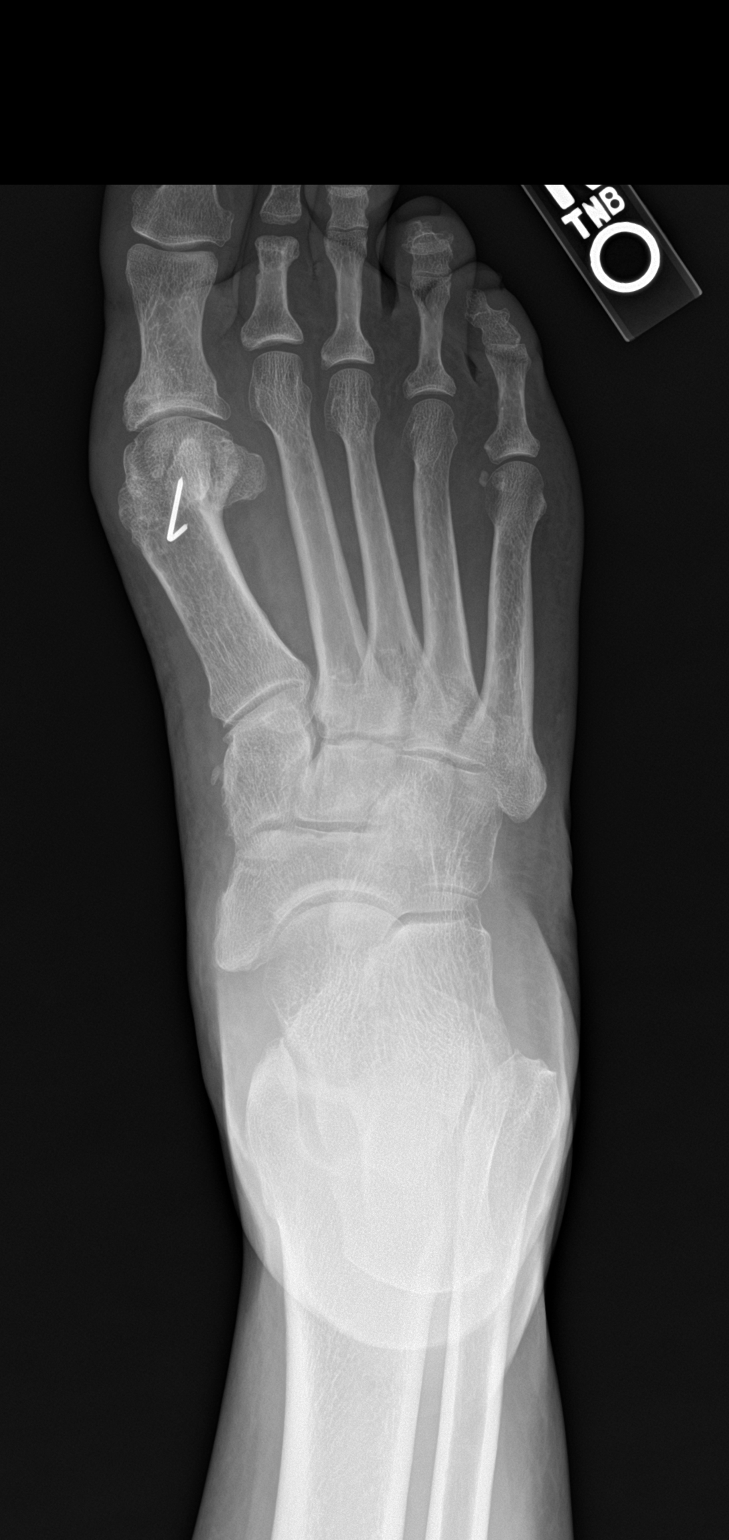

[foot obl]
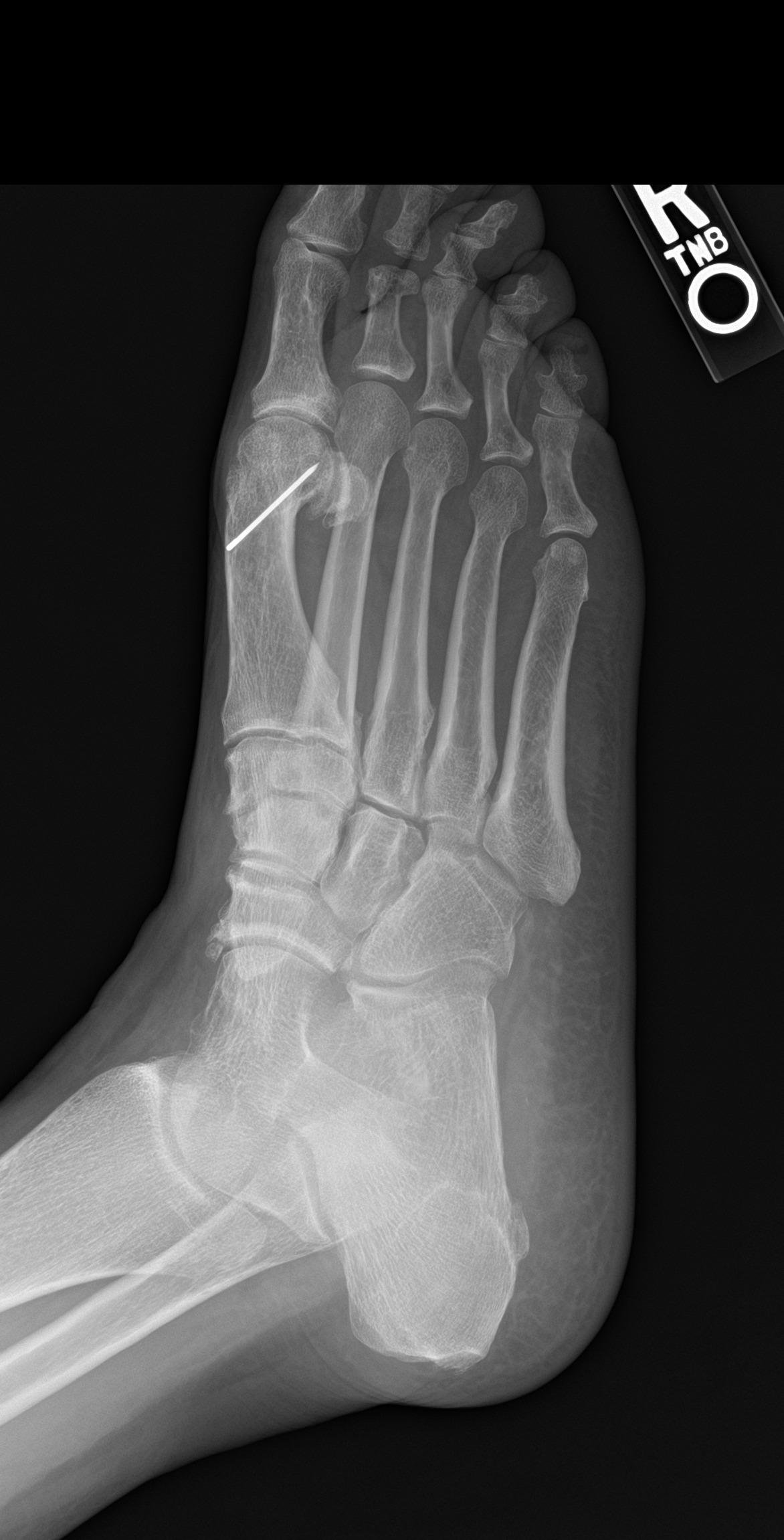

[foot lat]
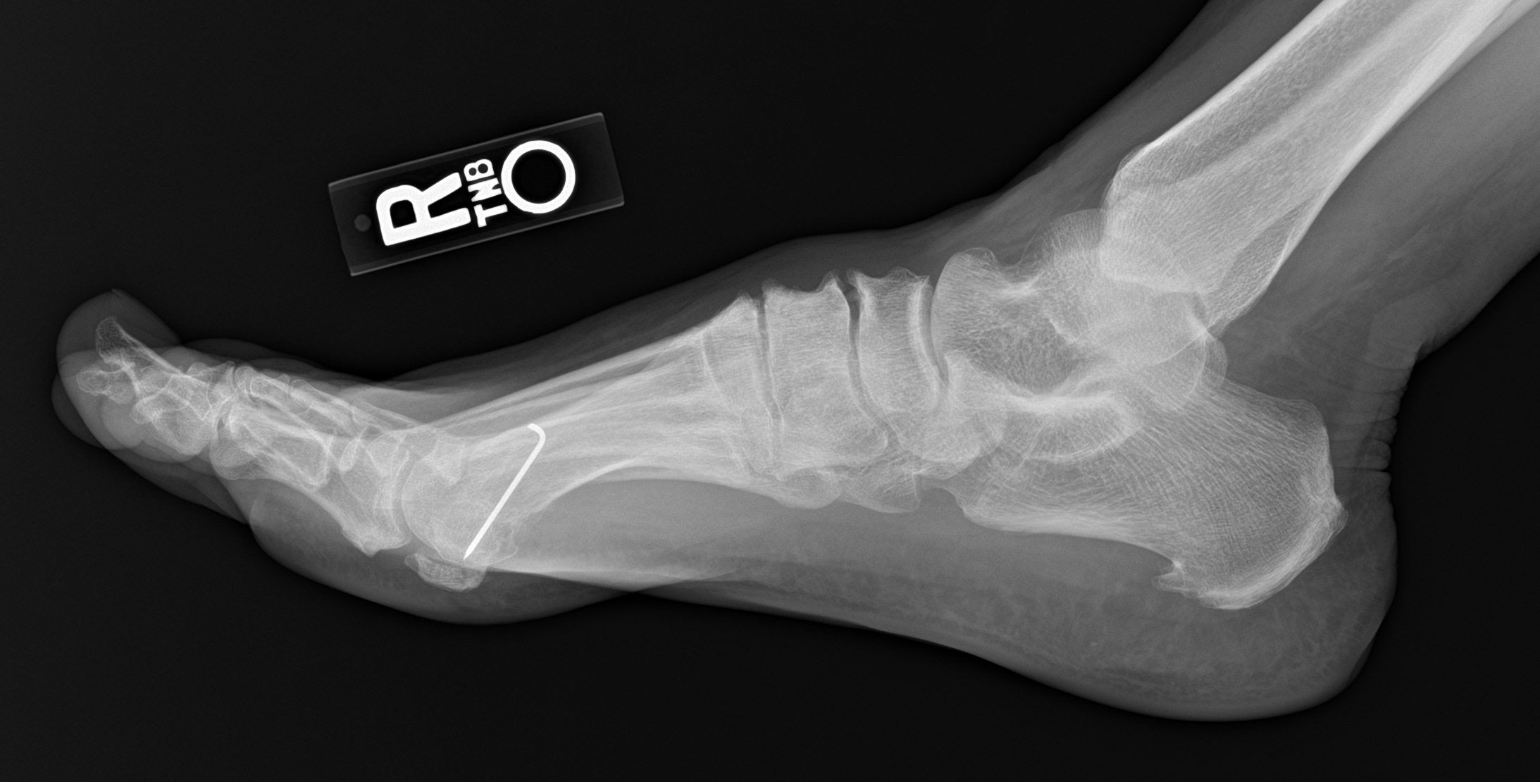

[3 of 3 positions shown; findings below may reference images not displayed]

FINDINGS: No acute bony or joint abnormality is identified. The patient is
status post hallux valgus repair and resection arthroplasty of the
head of the proximal phalanx of the second toe. There is moderate
midfoot and mild first MTP osteoarthritis. Calcaneal spur noted.
Soft tissues unremarkable.
IMPRESSION: No acute abnormality.

Moderate midfoot and mild first MTP osteoarthritis.

## 2022-01-26 ENCOUNTER — Telehealth: Payer: Self-pay | Admitting: Nurse Practitioner

## 2022-01-26 NOTE — Telephone Encounter (Signed)
Bootjack with me if ok with Lauren!

## 2022-01-26 NOTE — Telephone Encounter (Signed)
Pt is calling and would like to transfer to dr Legrand Como

## 2022-01-30 NOTE — Telephone Encounter (Signed)
Pt has been sch

## 2022-02-13 ENCOUNTER — Ambulatory Visit: Payer: 59 | Admitting: Family Medicine

## 2022-02-20 ENCOUNTER — Encounter: Payer: Self-pay | Admitting: Family Medicine

## 2022-02-20 ENCOUNTER — Ambulatory Visit (INDEPENDENT_AMBULATORY_CARE_PROVIDER_SITE_OTHER): Payer: 59 | Admitting: Family Medicine

## 2022-02-20 VITALS — BP 136/80 | HR 51 | Temp 98.4°F | Ht 68.0 in | Wt 210.6 lb

## 2022-02-20 DIAGNOSIS — M25562 Pain in left knee: Secondary | ICD-10-CM | POA: Diagnosis not present

## 2022-02-20 DIAGNOSIS — M170 Bilateral primary osteoarthritis of knee: Secondary | ICD-10-CM | POA: Diagnosis not present

## 2022-02-20 DIAGNOSIS — I10 Essential (primary) hypertension: Secondary | ICD-10-CM

## 2022-02-20 DIAGNOSIS — G8929 Other chronic pain: Secondary | ICD-10-CM

## 2022-02-20 DIAGNOSIS — L2082 Flexural eczema: Secondary | ICD-10-CM

## 2022-02-20 MED ORDER — CELECOXIB 200 MG PO CAPS: 200.0000 mg | ORAL_CAPSULE | Freq: Two times a day (BID) | ORAL | 2 refills | Status: DC

## 2022-02-20 MED ORDER — LISINOPRIL-HYDROCHLOROTHIAZIDE 20-25 MG PO TABS: 1.0000 | ORAL_TABLET | Freq: Every day | ORAL | 0 refills | Status: DC

## 2022-02-20 MED ORDER — CLOBETASOL PROPIONATE 0.05 % EX OINT: 1.0000 | TOPICAL_OINTMENT | Freq: Two times a day (BID) | CUTANEOUS | 2 refills | Status: DC

## 2022-02-20 NOTE — Progress Notes (Unsigned)
Established Patient Office Visit  Subjective   Patient ID: Alexandra Moyer, female    DOB: [birth year]  Age: 62 y.o. MRN: [id number]  Chief Complaint  Patient presents with   Establish Care    Patient is hers for transition of care visit.   Knee pain-- patient reports that she is having pain, states that she has had surgery on her knees in the past, states that she had meniscal surgery in 2021, was seeing Dr. Laurance Flatten at that time. Patient reports that the left is worse than the right, states that the left knee stays swollen. Patient has tried meloxicam in the past but it was ineffective.   Eczema- pt has flexural eczema, states she has been using triamcinolone cream but it doesn't seem to be very effective. States that it is mostly on her arms and neck.   HTN -- pt has a history of this as well. Is currently on lisinopril/HCT 20/25 mg BP performed in office today and is 136/80. Pt states she does check her BP at home and it is usually less than 140.   I reviewed her health maintenance, she is UTD on her colonoscopy and mammogram screening. She is due for her pap smear and annual bloodwork.    Current Outpatient Medications  Medication Instructions   amLODipine (NORVASC) 10 MG tablet Oral   celecoxib (CELEBREX) 200 mg, Oral, 2 times daily   clobetasol ointment (TEMOVATE) 9.21 % 1 Application, Topical, 2 times daily   lisinopril-hydrochlorothiazide (ZESTORETIC) 20-25 MG tablet 1 tablet, Oral, Daily   triamcinolone ointment (KENALOG) 0.1 % 1 application , Topical, 2 times daily    Patient Active Problem List   Diagnosis Date Noted   Chronic pain of left knee 06/26/2021   Eczema 06/26/2021   Primary hypertension 06/26/2021      Review of Systems  All other systems reviewed and are negative.     Objective:     BP 136/80 (BP Location: Left Arm, Patient Position: Sitting, Cuff Size: Large)   Pulse (!) 51   Temp 98.4 F (36.9 C) (Oral)   Ht '5\' 8"'$  (1.727 m)   Wt 210 lb 9.6 oz  (95.5 kg)   LMP 01/30/2011   SpO2 97%   BMI 32.02 kg/m  BP Readings from Last 3 Encounters:  02/20/22 136/80  06/26/21 132/82  05/29/19 113/77      Physical Exam Vitals reviewed.  Constitutional:      Appearance: Normal appearance. She is well-groomed. She is obese.  Eyes:     Conjunctiva/sclera: Conjunctivae normal.  Neck:     Thyroid: No thyromegaly.  Cardiovascular:     Rate and Rhythm: Normal rate and regular rhythm.     Pulses: Normal pulses.     Heart sounds: S1 normal and S2 normal.  Pulmonary:     Effort: Pulmonary effort is normal.     Breath sounds: Normal breath sounds and air entry.  Abdominal:     General: Bowel sounds are normal.  Musculoskeletal:     Right lower leg: No edema.     Left lower leg: No edema.  Neurological:     Mental Status: She is alert and oriented to person, place, and time. Mental status is at baseline.     Gait: Gait is intact.  Psychiatric:        Mood and Affect: Mood and affect normal.        Speech: Speech normal.        Behavior: Behavior normal.  Judgment: Judgment normal.      No results found for any visits on 02/20/22.    The ASCVD Risk score (Arnett DK, et al., 2019) failed to calculate for the following reasons:   Cannot find a previous HDL lab   Cannot find a previous total cholesterol lab    Assessment & Plan:   Problem List Items Addressed This Visit       Unprioritized   Chronic pain of left knee    Patinet has been seen by orthopedics in the past, states she does not want injections at this time. I will siwtch her from the meloxicam to celebrex 200 mg capsules BID PRN knee pain. If this does not improve her pain then I advised her that she needed to see ortho again and consider the injections.       Relevant Medications   celecoxib (CELEBREX) 200 MG capsule   Eczema    Will switch cream to clobetasol 0.05% cream to see if this will help. I will see her back in 3 months for her annual and will  re-evaluate her skin at that time.       Relevant Medications   clobetasol ointment (TEMOVATE) 0.05 %   Primary hypertension    Current hypertension medications:       Sig   amLODipine (NORVASC) 10 MG tablet (Taking) Take by mouth.   lisinopril-hydrochlorothiazide (ZESTORETIC) 20-25 MG tablet Take 1 tablet by mouth daily.     BP is well controlled on the above medications, will continue these as prescribed.      Relevant Medications   lisinopril-hydrochlorothiazide (ZESTORETIC) 20-25 MG tablet   Other Visit Diagnoses     Primary osteoarthritis of both knees    -  Primary   Relevant Medications   celecoxib (CELEBREX) 200 MG capsule       Return in about 3 months (around 05/21/2022) for annual physical with pap smear.    Farrel Conners, MD

## 2022-02-23 NOTE — Assessment & Plan Note (Signed)
Current hypertension medications:       Sig   amLODipine (NORVASC) 10 MG tablet (Taking) Take by mouth.   lisinopril-hydrochlorothiazide (ZESTORETIC) 20-25 MG tablet Take 1 tablet by mouth daily.      BP is well controlled on the above medications, will continue these as prescribed.

## 2022-02-23 NOTE — Assessment & Plan Note (Signed)
Will switch cream to clobetasol 0.05% cream to see if this will help. I will see her back in 3 months for her annual and will re-evaluate her skin at that time.

## 2022-02-23 NOTE — Assessment & Plan Note (Signed)
Patinet has been seen by orthopedics in the past, states she does not want injections at this time. I will siwtch her from the meloxicam to celebrex 200 mg capsules BID PRN knee pain. If this does not improve her pain then I advised her that she needed to see ortho again and consider the injections.

## 2022-05-18 ENCOUNTER — Other Ambulatory Visit: Payer: Self-pay | Admitting: Family Medicine

## 2022-05-18 DIAGNOSIS — I10 Essential (primary) hypertension: Secondary | ICD-10-CM

## 2022-05-22 ENCOUNTER — Encounter: Payer: 59 | Admitting: Family Medicine

## 2022-06-26 ENCOUNTER — Ambulatory Visit (INDEPENDENT_AMBULATORY_CARE_PROVIDER_SITE_OTHER): Payer: 59 | Admitting: Family Medicine

## 2022-06-26 ENCOUNTER — Encounter: Payer: Self-pay | Admitting: Family Medicine

## 2022-06-26 VITALS — BP 120/82 | HR 77 | Temp 98.1°F | Ht 68.0 in | Wt 207.4 lb

## 2022-06-26 DIAGNOSIS — Z0000 Encounter for general adult medical examination without abnormal findings: Secondary | ICD-10-CM

## 2022-06-26 DIAGNOSIS — I10 Essential (primary) hypertension: Secondary | ICD-10-CM | POA: Diagnosis not present

## 2022-06-26 DIAGNOSIS — Z13220 Encounter for screening for lipoid disorders: Secondary | ICD-10-CM

## 2022-06-26 DIAGNOSIS — Z1231 Encounter for screening mammogram for malignant neoplasm of breast: Secondary | ICD-10-CM | POA: Diagnosis not present

## 2022-06-26 LAB — LIPID PANEL
Cholesterol: 217 mg/dL — ABNORMAL HIGH (ref 0–200)
HDL: 72.8 mg/dL (ref >39.00)
LDL Cholesterol: 131 mg/dL — ABNORMAL HIGH (ref 0–99)
NonHDL: 144.27
Total CHOL/HDL Ratio: 3
Triglycerides: 68 mg/dL (ref 0.0–149.0)
VLDL: 13.6 mg/dL (ref 0.0–40.0)

## 2022-06-26 LAB — CBC WITH DIFFERENTIAL/PLATELET
Basophils Absolute: 0 10*3/uL (ref 0.0–0.1)
Basophils Relative: 0.8 % (ref 0.0–3.0)
Eosinophils Absolute: 0.1 10*3/uL (ref 0.0–0.7)
Eosinophils Relative: 2.7 % (ref 0.0–5.0)
HCT: 39.2 % (ref 36.0–46.0)
Hemoglobin: 13.1 g/dL (ref 12.0–15.0)
Lymphocytes Relative: 38.1 % (ref 12.0–46.0)
Lymphs Abs: 1.5 10*3/uL (ref 0.7–4.0)
MCHC: 33.5 g/dL (ref 30.0–36.0)
MCV: 86.8 fl (ref 78.0–100.0)
Monocytes Absolute: 0.4 10*3/uL (ref 0.1–1.0)
Monocytes Relative: 10 % (ref 3.0–12.0)
Neutro Abs: 1.9 10*3/uL (ref 1.4–7.7)
Neutrophils Relative %: 48.4 % (ref 43.0–77.0)
Platelets: 198 10*3/uL (ref 150.0–400.0)
RBC: 4.52 Mil/uL (ref 3.87–5.11)
RDW: 13.8 % (ref 11.5–15.5)
WBC: 3.9 10*3/uL — ABNORMAL LOW (ref 4.0–10.5)

## 2022-06-26 LAB — COMPREHENSIVE METABOLIC PANEL
ALT: 29 U/L (ref 0–35)
AST: 32 U/L (ref 0–37)
Albumin: 4.7 g/dL (ref 3.5–5.2)
Alkaline Phosphatase: 81 U/L (ref 39–117)
BUN: 19 mg/dL (ref 6–23)
CO2: 26 mEq/L (ref 19–32)
Calcium: 10.1 mg/dL (ref 8.4–10.5)
Chloride: 105 mEq/L (ref 96–112)
Creatinine, Ser: 1.11 mg/dL (ref 0.40–1.20)
GFR: 54.36 mL/min — ABNORMAL LOW (ref >60.00)
Glucose, Bld: 99 mg/dL (ref 70–99)
Potassium: 4 mEq/L (ref 3.5–5.1)
Sodium: 140 mEq/L (ref 135–145)
Total Bilirubin: 0.6 mg/dL (ref 0.2–1.2)
Total Protein: 8 g/dL (ref 6.0–8.3)

## 2022-06-26 LAB — TSH: TSH: 0.86 u[IU]/mL (ref 0.35–5.50)

## 2022-06-26 NOTE — Progress Notes (Signed)
Complete physical exam  Patient: Alexandra Moyer   DOB: 04/16/1960   62 y.o. Female  MRN: [id number]  Subjective:    Chief Complaint  Patient presents with   Annual Exam    Alexandra Moyer is a 62 y.o. female who presents today for a complete physical exam. She reports consuming a general diet. Home exercise routine includes walking 2 hrs per week. She generally feels well. She reports sleeping well. She does not have additional problems to discuss today.    Most recent fall risk assessment:     No data to display           Most recent depression screenings:    06/26/2021    3:40 PM  PHQ 2/9 Scores  PHQ - 2 Score 0  PHQ- 9 Score 2    Vision:Within last year and Dental: No current dental problems and Receives regular dental care  Patient Active Problem List   Diagnosis Date Noted   Chronic pain of left knee 06/26/2021   Eczema 06/26/2021   Primary hypertension 06/26/2021      Patient Care Team: Karie Georges, MD as PCP - General (Family Medicine)   Outpatient Medications Prior to Visit  Medication Sig   amLODipine (NORVASC) 10 MG tablet Take by mouth.   celecoxib (CELEBREX) 200 MG capsule Take 1 capsule (200 mg total) by mouth 2 (two) times daily.   clobetasol ointment (TEMOVATE) 0.05 % Apply 1 Application topically 2 (two) times daily.   lisinopril-hydrochlorothiazide (ZESTORETIC) 20-25 MG tablet TAKE 1 TABLET BY MOUTH DAILY   triamcinolone ointment (KENALOG) 0.1 % Apply 1 application  topically 2 (two) times daily.   No facility-administered medications prior to visit.    Review of Systems  HENT:  Negative for hearing loss.   Eyes:  Negative for blurred vision.  Respiratory:  Negative for shortness of breath.   Cardiovascular:  Negative for chest pain.  Gastrointestinal: Negative.   Genitourinary: Negative.   Musculoskeletal:  Negative for back pain.  Neurological:  Negative for headaches.  Psychiatric/Behavioral:  Negative for depression.         Objective:     BP 120/82 (BP Location: Left Arm, Patient Position: Sitting, Cuff Size: Large)   Pulse 77   Temp 98.1 F (36.7 C) (Oral)   Ht 5\' 8"  (1.727 m)   Wt 207 lb 6.4 oz (94.1 kg)   LMP 01/30/2011   SpO2 97%   BMI 31.54 kg/m    Physical Exam Vitals reviewed.  Constitutional:      Appearance: Normal appearance. She is well-groomed and overweight.  HENT:     Right Ear: Tympanic membrane normal.     Left Ear: Tympanic membrane normal.     Mouth/Throat:     Mouth: Mucous membranes are moist.     Pharynx: No posterior oropharyngeal erythema.  Eyes:     Conjunctiva/sclera: Conjunctivae normal.  Neck:     Thyroid: No thyromegaly.  Cardiovascular:     Rate and Rhythm: Normal rate and regular rhythm.     Pulses: Normal pulses.     Heart sounds: S1 normal and S2 normal.  Pulmonary:     Effort: Pulmonary effort is normal.     Breath sounds: Normal breath sounds and air entry.  Abdominal:     General: Bowel sounds are normal.     Palpations: Abdomen is soft.  Musculoskeletal:     Right lower leg: No edema.     Left lower leg: No edema.  Lymphadenopathy:     Cervical: No cervical adenopathy.  Neurological:     Mental Status: She is alert and oriented to person, place, and time. Mental status is at baseline.     Gait: Gait is intact.  Psychiatric:        Mood and Affect: Mood and affect normal.        Speech: Speech normal.        Behavior: Behavior normal.        Judgment: Judgment normal.      No results found for any visits on 06/26/22.     Assessment & Plan:    Routine Health Maintenance and Physical Exam  Immunization History  Administered Date(s) Administered   Influenza-Unspecified 10/19/2021   Tdap 06/25/2020   Zoster Recombinat (Shingrix) 06/25/2021    Health Maintenance  Topic Date Due   COVID-19 Vaccine (1) Never done   Zoster Vaccines- Shingrix (2 of 2) 08/20/2021   Hepatitis C Screening  02/21/2023 (Originally 11/05/1980)   HIV  Screening  02/21/2023 (Originally 11/05/1977)   PAP SMEAR-Modifier  06/25/2023 (Originally 11/09/2013)   INFLUENZA VACCINE  08/20/2022   Colonoscopy  02/21/2023   MAMMOGRAM  07/05/2023   DTaP/Tdap/Td (2 - Td or Tdap) 06/26/2030   HPV VACCINES  Aged Out    Discussed health benefits of physical activity, and encouraged her to engage in regular exercise appropriate for her age and condition.  Primary hypertension -     Comprehensive metabolic panel -     TSH  Routine general medical examination at a health care facility -     CBC with Differential/Platelet Normal physical exam findings today. Patient has  had a hysterectomy in 2013 and does not have a cervix, this was confirmed by reviewing the operative report. She is due for her mammogram this month. Healthy eating and exercise was reviewed with the patient and handouts given. Ordering lipid screening today to assess overall cardiac risk.   Lipid screening -     Lipid panel; Future  Breast cancer screening by mammogram -     3D Screening Mammogram, Left and Right; Future    Return in 6 months (on 12/26/2022) for HTN.     Karie Georges, MD

## 2022-06-26 NOTE — Patient Instructions (Signed)

## 2022-11-01 ENCOUNTER — Other Ambulatory Visit: Payer: Self-pay | Admitting: Family Medicine

## 2022-11-01 DIAGNOSIS — I10 Essential (primary) hypertension: Secondary | ICD-10-CM

## 2022-11-02 ENCOUNTER — Other Ambulatory Visit: Payer: Self-pay | Admitting: Family Medicine

## 2022-11-02 ENCOUNTER — Telehealth: Payer: Self-pay | Admitting: Family Medicine

## 2022-11-02 DIAGNOSIS — I10 Essential (primary) hypertension: Secondary | ICD-10-CM

## 2022-11-02 MED ORDER — LISINOPRIL-HYDROCHLOROTHIAZIDE 20-25 MG PO TABS: 1.0000 | ORAL_TABLET | Freq: Every day | ORAL | 0 refills | Status: DC

## 2022-11-02 MED ORDER — AMLODIPINE BESYLATE 10 MG PO TABS: 10.0000 mg | ORAL_TABLET | Freq: Every day | ORAL | 1 refills | Status: DC

## 2022-11-02 NOTE — Telephone Encounter (Signed)
Pt needs a refill on Lisinopril/hydrochlorothiazide and Amlodipine.  Patient is out of the Lisinopril/hydrochlorothiazide and only has 2 of Amlodipine.  She states she thought it was called in at her visit.   Pharmacy- [facility] on [address] on [address] (for just this time)

## 2022-11-02 NOTE — Telephone Encounter (Signed)
Rx done for Lisinopril/hydrochlorothiazide.  Message sent to PCP for Amlodipine as the Rx is listed as an historical med.

## 2022-11-02 NOTE — Telephone Encounter (Signed)
Pt called back to say she only has 2 pills left. Pt is asking if MD will send refill or not?  [id number] [place], [place] -  [phone] AT [facility] OF [facility] & [address] Phone: [phone]  Fax: [phone]

## 2022-11-02 NOTE — Telephone Encounter (Signed)
Spoke with the patient and informed her refills were sent as below.  Patient also reminded of the refill policy in the future in which it can take 48-72 hours for refills.  Offered to schedule a follow up as patient is due in December, she stated she will check her work schedule and call back.

## 2022-11-02 NOTE — Telephone Encounter (Signed)
Script sent  

## 2023-02-04 ENCOUNTER — Other Ambulatory Visit: Payer: Self-pay | Admitting: Family Medicine

## 2023-02-04 DIAGNOSIS — I10 Essential (primary) hypertension: Secondary | ICD-10-CM

## 2023-02-09 ENCOUNTER — Ambulatory Visit: Payer: Self-pay | Admitting: Family Medicine

## 2023-02-09 NOTE — Telephone Encounter (Signed)
Copied from CRM [id number]. Topic: Clinical - Red Word Triage >> Feb 09, 2023  9:08 AM Marica Otter wrote: [facility] that prompted transfer to Nurse Triage: Patient states she's having some idizziness hot, headaches, chills, runny nose   Chief Complaint: Patient states she's having some dizziness, hot, headaches, chills, runny nose and cough. Symptoms:  Cough and runny nose  , headaches. Explains the dry cough is the worst. Frequency: Started on Saturday  Disposition: [] ED /[] Urgent Care (no appt availability in office) / [x] Appointment(In office/virtual)/ []  [facility]/ [] Home Care/ [] Refused Recommended Disposition /[] [place] [facility]/ []  Follow-up with PCP Additional Notes:  Scheduled in the office today.  Reason for Disposition  Coughing up rusty-colored (reddish-brown) sputum  Answer Assessment - Initial Assessment Questions 1. ONSET: "When did the cough begin?"       Saturday 2. SEVERITY: "How bad is the cough today?"       Cough today - nothing is coming up 3. SPUTUM: "Describe the color of your sputum" (none, dry cough; clear, white, yellow, green)     Dry cough 4. HEMOPTYSIS: "Are you coughing up any blood?" If so ask: "How much?" (flecks, streaks, tablespoons, etc.)     Denies. 5. DIFFICULTY BREATHING: "Are you having difficulty breathing?" If Yes, ask: "How bad is it?" (e.g., mild, moderate, severe)     - MILD: No SOB at rest, mild SOB with walking, speaks normally in sentences, can lie down, no retractions, pulse < 100.    6. FEVER: "Do you have a fever?" If Yes, ask: "What is your temperature, how was it measured, and when did it start?"     Denies 7. CARDIAC HISTORY: "Do you have any history of heart disease?" (e.g., heart attack, congestive heart failure)      Denies 8. LUNG HISTORY: "Do you have any history of lung disease?"  (e.g., pulmonary embolus, asthma, emphysema)     Denies 9. PE RISK FACTORS: "Do you have a history of blood clots?" (or: recent  major surgery, recent prolonged travel, bedridden)     Denies 10. OTHER SYMPTOMS: "Do you have any other symptoms?" (e.g., runny nose, wheezing, chest pain)      Chest pain   yesterday - she took something for gas and it went away  11. PREGNANCY: "Is there any chance you are pregnant?" "When was your last menstrual period?"        Denies 12. TRAVEL: "Have you traveled out of the country in the last month?" (e.g., travel history, exposures)      Denies.  Protocols used: Cough - Acute Productive-A-AH

## 2023-02-12 ENCOUNTER — Encounter: Payer: Self-pay | Admitting: Family Medicine

## 2023-02-12 ENCOUNTER — Ambulatory Visit (INDEPENDENT_AMBULATORY_CARE_PROVIDER_SITE_OTHER): Payer: 59 | Admitting: Family Medicine

## 2023-02-12 VITALS — BP 128/80 | HR 82 | Temp 98.5°F | Resp 16 | Ht 68.0 in | Wt 204.0 lb

## 2023-02-12 DIAGNOSIS — R051 Acute cough: Secondary | ICD-10-CM

## 2023-02-12 DIAGNOSIS — I499 Cardiac arrhythmia, unspecified: Secondary | ICD-10-CM | POA: Diagnosis not present

## 2023-02-12 DIAGNOSIS — J069 Acute upper respiratory infection, unspecified: Secondary | ICD-10-CM

## 2023-02-12 MED ORDER — HYDROCODONE BIT-HOMATROP MBR 5-1.5 MG/5ML PO SOLN: 5.0000 mL | Freq: Two times a day (BID) | ORAL | 0 refills | Status: AC | PRN

## 2023-02-12 MED ORDER — FLUTICASONE PROPIONATE 50 MCG/ACT NA SUSP: 1.0000 | Freq: Two times a day (BID) | NASAL | 0 refills | Status: DC

## 2023-02-12 MED ORDER — BENZONATATE 100 MG PO CAPS: 200.0000 mg | ORAL_CAPSULE | Freq: Two times a day (BID) | ORAL | 0 refills | Status: AC | PRN

## 2023-02-12 NOTE — Patient Instructions (Addendum)
A few things to remember from today's visit:  URI, acute - Plan: fluticasone (FLONASE) 50 MCG/ACT nasal spray  Acute cough - Plan: benzonatate (TESSALON) 100 MG capsule, HYDROcodone bit-homatropine (HYCODAN) 5-1.5 MG/5ML syrup  Irregular heart rate  Monitor heart rate. If you decide to have EKG done please let us know.  If you need refills for medications you take chronically, please call your pharmacy. Do not use My Chart to request refills or for acute issues that need immediate attention. If you send a my chart message, it may take a few days to be addressed, specially if I am not in the office.  Please be sure medication list is accurate. If a new problem present, please set up appointment sooner than planned today.

## 2023-02-12 NOTE — Progress Notes (Unsigned)
ACUTE VISIT Chief Complaint  Patient presents with   Cough    Started last Saturday, sometimes productive    HPI: [name] is a 63 y.o. female with a PMHx significant for HTN, eczema, and chronic left knee pain, who is here today complaining of cough since 02/05/2022.  She says the cough is occasionally productive.    She also endorses subjective fever on 1/18 and 1/19, sore throat, frontal pressure headache, nasal congestion, rhinorrhea, body aches, sleep interference due to cough, SOB "sometimes", and one instance of "little" chest pain (points to left costochondral joint) at rest.  Cough This is a new problem. The current episode started in the past 7 days. The problem has been unchanged. The cough is Non-productive. Associated symptoms include myalgias, postnasal drip and rhinorrhea. Pertinent negatives include no ear congestion, ear pain, heartburn, hemoptysis, rash or weight loss. The symptoms are aggravated by lying down. She has tried OTC cough suppressant for the symptoms. The treatment provided no relief. Her past medical history is significant for environmental allergies. There is no history of COPD.   She has tried Mucinex and Theraflu, neither of which has helped with her symptoms.  Hasn't taken a home Covid test.  Pertinent negatives include wheezing, abdominal pain, nausea, vomiting, or known sick contacts.   Noted irregular HR today, denies prior hx and has not noted palpitations. HTN on amlodipine 10 mg daily and lisinopril-HCTZ 20-25 mg. She has not had chest pain,SOB,diaphoresis while exercising before illness.   Lab Results  Component Value Date   NA 140 06/26/2022   CL 105 06/26/2022   K 4.0 06/26/2022   CO2 26 06/26/2022   BUN 19 06/26/2022   CREATININE 1.11 06/26/2022   GFR 54.36 (L) 06/26/2022   CALCIUM 10.1 06/26/2022   ALBUMIN 4.7 06/26/2022   GLUCOSE 99 06/26/2022   Lab Results  Component Value Date   TSH 0.86 06/26/2022   Lab Results   Component Value Date   WBC 3.9 (L) 06/26/2022   HGB 13.1 06/26/2022   HCT 39.2 06/26/2022   MCV 86.8 06/26/2022   PLT 198.0 06/26/2022   Review of Systems  Constitutional:  Positive for activity change, appetite change and fatigue. Negative for weight loss.  HENT:  Positive for postnasal drip and rhinorrhea. Negative for ear pain, mouth sores and nosebleeds.   Respiratory:  Positive for cough. Negative for hemoptysis.   Cardiovascular:  Negative for leg swelling.  Gastrointestinal:  Negative for abdominal pain, diarrhea, heartburn and vomiting.  Endocrine: Negative for cold intolerance and heat intolerance.  Genitourinary:  Negative for decreased urine volume, dysuria and hematuria.  Musculoskeletal:  Positive for myalgias.  Skin:  Negative for rash.  Allergic/Immunologic: Positive for environmental allergies.  Neurological:  Negative for syncope and facial asymmetry.  Psychiatric/Behavioral:  Negative for confusion and hallucinations.   See other pertinent positives and negatives in HPI.  Current Outpatient Medications on File Prior to Visit  Medication Sig Dispense Refill   amLODipine (NORVASC) 10 MG tablet Take 1 tablet (10 mg total) by mouth daily. 90 tablet 1   celecoxib (CELEBREX) 200 MG capsule Take 1 capsule (200 mg total) by mouth 2 (two) times daily. 180 capsule 2   clobetasol ointment (TEMOVATE) 0.05 % Apply 1 Application topically 2 (two) times daily. 60 g 2   lisinopril-hydrochlorothiazide (ZESTORETIC) 20-25 MG tablet TAKE 1 TABLET BY MOUTH DAILY 90 tablet 0   triamcinolone ointment (KENALOG) 0.1 % Apply 1 application  topically 2 (two) times daily. 453.6  g 0   No current facility-administered medications on file prior to visit.   Past Medical History:  Diagnosis Date   Arthritis of knee, right    Eczema    Hypertension    No Known Allergies  Social History   Socioeconomic History   Marital status: Single    Spouse name: Not on file   Number of children:  Not on file   Years of education: Not on file   Highest education level: Not on file  Occupational History   Not on file  Tobacco Use   Smoking status: Never   Smokeless tobacco: Never  Vaping Use   Vaping status: Never Used  Substance and Sexual Activity   Alcohol use: Yes    Comment: rarely   Drug use: Never   Sexual activity: Not Currently    Birth control/protection: Surgical  Other Topics Concern   Not on file  Social History Narrative   Not on file   Social Drivers of Health   Financial Resource Strain: Not on file  Food Insecurity: Not on file  Transportation Needs: Not on file  Physical Activity: Not on file  Stress: Not on file  Social Connections: Not on file   Vitals:   02/12/23 1503  BP: 128/80  Pulse: 82  Resp: 16  Temp: 98.5 F (36.9 C)  SpO2: 98%   Body mass index is 31.02 kg/m.  Physical Exam Vitals and nursing note reviewed.  Constitutional:      General: She is not in acute distress.    Appearance: She is well-developed. She is not ill-appearing.  HENT:     Head: Atraumatic.     Right Ear: Tympanic membrane, ear canal and external ear normal.     Left Ear: Tympanic membrane, ear canal and external ear normal.     Nose: Congestion and rhinorrhea present.     Right Sinus: No maxillary sinus tenderness or frontal sinus tenderness.     Left Sinus: No maxillary sinus tenderness or frontal sinus tenderness.     Mouth/Throat:     Mouth: Mucous membranes are moist.     Pharynx: Oropharynx is clear. Uvula midline.  Eyes:     Conjunctiva/sclera: Conjunctivae normal.  Cardiovascular:     Rate and Rhythm: Normal rate. Rhythm irregular.     Heart sounds: No murmur heard. Pulmonary:     Effort: Pulmonary effort is normal. No respiratory distress.     Breath sounds: Normal breath sounds. No stridor.  Musculoskeletal:     Cervical back: No edema or erythema. No muscular tenderness.  Lymphadenopathy:     Cervical: No cervical adenopathy.  Skin:     General: Skin is warm.     Findings: No erythema or rash.  Neurological:     Mental Status: She is alert and oriented to person, place, and time.  Psychiatric:        Mood and Affect: Mood and affect normal.   ASSESSMENT AND PLAN:  [name] was seen today for cough.  Lab Results  Component Value Date   WBC 3.9 02/12/2023   HGB 13.0 02/12/2023   HCT 39.0 02/12/2023   MCV 88 02/12/2023   PLT 200 02/12/2023   Lab Results  Component Value Date   NA 142 02/12/2023   CL 105 02/12/2023   K 4.2 02/12/2023   CO2 24 02/12/2023   BUN 15 02/12/2023   CREATININE 0.91 02/12/2023   EGFR 72 02/12/2023   CALCIUM 9.7 02/12/2023  ALBUMIN 4.7 06/26/2022   GLUCOSE 95 02/12/2023   Lab Results  Component Value Date   TSH 0.691 02/12/2023   URI, acute Symptoms suggests a viral etiology, I explained that symptomatic treatment is usually recommended in this case, so I do not think abx is needed at this time. Instructed to monitor for signs of complications, including recurrent fever among some, clearly instructed about warning signs. Recommend Flonase nasal spray at bedtime for 10-14 days and nasal saline irrigations to help with nasal congestion and rhinorrhea. I also explained that cough and nasal congestion can last a few days and sometimes weeks. F/U as needed.  Letter for work provided, she would like to go back tomorrow.  -     Fluticasone Propionate; Place 1 spray into both nostrils 2 (two) times daily.  Dispense: 16 g; Refill: 0  Acute cough Lung auscultation clear, so I do not think imaging is needed, still offered , she agrees with holding on it for now. Monitor for new symptoms. Benzonatate and Hycodan for cough management during the day and bedtime respectively.   -     Benzonatate; Take 2 capsules (200 mg total) by mouth 2 (two) times daily as needed for up to 10 days.  Dispense: 20 capsule; Refill: 0 -     HYDROcodone Bit-Homatrop MBr; Take 5 mLs by mouth every 12 (twelve)  hours as needed for up to 10 days for cough.  Dispense: 80 mL; Refill: 0  Irregular heart rate Noted today during examination, she denies prior history She reports an episode of CP that happen at rest, no radiated, and can pinpoint area. No prior EKG found.. Declined EKG. She was clearly instructed about warning signs. She agrees with blood work today.  Follow-up with PCP in 1 week.  -     CBC; Future -     TSH; Future -     Basic metabolic panel; Future  Return in about 1 week (around 02/19/2023) for irregular HR with PCP.  I, Rolla Etienne Wierda, acting as a scribe for Ezme Duch [place], MD., have documented all relevant documentation on the behalf of Chalise Pe [place], MD, as directed by  Jullian Clayson [place], MD while in the presence of Nargis Abrams [place], MD.   I, Britian Jentz [place], MD, have reviewed all documentation for this visit. The documentation on 02/12/23 for the exam, diagnosis, procedures, and orders are all accurate and complete.  Raenell Mensing G. [place], MD  [facility] [place] office.

## 2023-02-13 ENCOUNTER — Encounter: Payer: Self-pay | Admitting: Family Medicine

## 2023-02-13 LAB — CBC
Hematocrit: 39 % (ref 34.0–46.6)
Hemoglobin: 13 g/dL (ref 11.1–15.9)
MCH: 29.3 pg (ref 26.6–33.0)
MCHC: 33.3 g/dL (ref 31.5–35.7)
MCV: 88 fL (ref 79–97)
Platelets: 200 10*3/uL (ref 150–450)
RBC: 4.44 x10E6/uL (ref 3.77–5.28)
RDW: 12.7 % (ref 11.7–15.4)
WBC: 3.9 10*3/uL (ref 3.4–10.8)

## 2023-02-13 LAB — BASIC METABOLIC PANEL
BUN/Creatinine Ratio: 16 (ref 12–28)
BUN: 15 mg/dL (ref 8–27)
CO2: 24 mmol/L (ref 20–29)
Calcium: 9.7 mg/dL (ref 8.7–10.3)
Chloride: 105 mmol/L (ref 96–106)
Creatinine, Ser: 0.91 mg/dL (ref 0.57–1.00)
Glucose: 95 mg/dL (ref 70–99)
Potassium: 4.2 mmol/L (ref 3.5–5.2)
Sodium: 142 mmol/L (ref 134–144)
eGFR: 72 mL/min/{1.73_m2} (ref >59)

## 2023-02-13 LAB — TSH: TSH: 0.691 u[IU]/mL (ref 0.450–4.500)

## 2023-02-19 ENCOUNTER — Ambulatory Visit (INDEPENDENT_AMBULATORY_CARE_PROVIDER_SITE_OTHER): Payer: 59 | Admitting: Family Medicine

## 2023-02-19 ENCOUNTER — Encounter: Payer: Self-pay | Admitting: Family Medicine

## 2023-02-19 VITALS — BP 118/70 | HR 95 | Temp 98.0°F | Ht 68.0 in | Wt 204.9 lb

## 2023-02-19 DIAGNOSIS — I499 Cardiac arrhythmia, unspecified: Secondary | ICD-10-CM | POA: Diagnosis not present

## 2023-02-19 DIAGNOSIS — I10 Essential (primary) hypertension: Secondary | ICD-10-CM | POA: Diagnosis not present

## 2023-02-19 NOTE — Progress Notes (Unsigned)
Established Patient Office Visit  Subjective   Patient ID: Alexandra Moyer, female    DOB: 1960/02/24  Age: 63 y.o. MRN: [id number]  Chief Complaint  Patient presents with  . Irregular Heart Beat  . Cough    Non-productive since last visit with Dr [place] 1 week ago  . Fatigue    Pt is here for follow up today. She recently was seen last week for acute URI, pt reports that she is feeling some better now, the coughing and congestion has been improving. She was also found on exam to have irregular heart rate. States that there was an episode at the beginning of her illness when she got very dizzy at work and had to be picked up. Cough is now nonproductive, no thightness in her chest. Is somewhat lightheaded at times but no fever or chills.    Current Outpatient Medications  Medication Instructions  . amLODipine (NORVASC) 10 mg, Oral, Daily  . benzonatate (TESSALON) 200 mg, Oral, 2 times daily PRN  . celecoxib (CELEBREX) 200 mg, Oral, 2 times daily  . clobetasol ointment (TEMOVATE) 0.05 % 1 Application, Topical, 2 times daily  . fluticasone (FLONASE) 50 MCG/ACT nasal spray 1 spray, Each Nare, 2 times daily  . HYDROcodone bit-homatropine (HYCODAN) 5-1.5 MG/5ML syrup 5 mLs, Oral, Every 12 hours PRN  . lisinopril-hydrochlorothiazide (ZESTORETIC) 20-25 MG tablet 1 tablet, Oral, Daily  . triamcinolone ointment (KENALOG) 0.1 % 1 application , Topical, 2 times daily    Patient Active Problem List   Diagnosis Date Noted  . Chronic pain of left knee 06/26/2021  . Eczema 06/26/2021  . Primary hypertension 06/26/2021      Review of Systems  All other systems reviewed and are negative.     Objective:     BP 118/70   Pulse 95   Temp 98 F (36.7 C) (Oral)   Ht 5\' 8"  (1.727 m)   Wt 204 lb 14.4 oz (92.9 kg)   LMP 01/30/2011   SpO2 98%   BMI 31.15 kg/m  {Vitals History (Optional):23777}  Physical Exam Vitals reviewed.  Constitutional:      Appearance: Normal appearance. She is  well-groomed and normal weight.  Eyes:     Conjunctiva/sclera: Conjunctivae normal.  Neck:     Thyroid: No thyromegaly.  Cardiovascular:     Rate and Rhythm: Normal rate and regular rhythm.     Pulses: Normal pulses.     Heart sounds: S1 normal and S2 normal.  Pulmonary:     Effort: Pulmonary effort is normal.     Breath sounds: Normal breath sounds and air entry.  Abdominal:     General: Bowel sounds are normal.  Musculoskeletal:     Right lower leg: No edema.     Left lower leg: No edema.  Neurological:     Mental Status: She is alert and oriented to person, place, and time. Mental status is at baseline.     Gait: Gait is intact.  Psychiatric:        Mood and Affect: Mood and affect normal.        Speech: Speech normal.        Behavior: Behavior normal.        Judgment: Judgment normal.     No results found for any visits on 02/19/23.  Last CBC Lab Results  Component Value Date   WBC 3.9 02/12/2023   HGB 13.0 02/12/2023   HCT 39.0 02/12/2023   MCV 88 02/12/2023   MCH  29.3 02/12/2023   RDW 12.7 02/12/2023   PLT 200 02/12/2023   Last metabolic panel Lab Results  Component Value Date   GLUCOSE 95 02/12/2023   NA 142 02/12/2023   K 4.2 02/12/2023   CL 105 02/12/2023   CO2 24 02/12/2023   BUN 15 02/12/2023   CREATININE 0.91 02/12/2023   EGFR 72 02/12/2023   CALCIUM 9.7 02/12/2023   PROT 8.0 06/26/2022   ALBUMIN 4.7 06/26/2022   BILITOT 0.6 06/26/2022   ALKPHOS 81 06/26/2022   AST 32 06/26/2022   ALT 29 06/26/2022   Last thyroid functions Lab Results  Component Value Date   TSH 0.691 02/12/2023      The 10-year ASCVD risk score (Arnett DK, et al., 2019) is: 4.9%    Assessment & Plan:  Primary hypertension  Irregular heart rate -     EKG 12-Lead     Return in about 6 months (around 08/19/2023).    Karie Georges, MD

## 2023-02-22 NOTE — Assessment & Plan Note (Signed)
Current hypertension medications:       Sig   amLODipine (NORVASC) 10 MG tablet (Taking) Take 1 tablet (10 mg total) by mouth daily.   lisinopril-hydrochlorothiazide (ZESTORETIC) 20-25 MG tablet (Taking) TAKE 1 TABLET BY MOUTH DAILY      Chronic, stable. BP is well controlled on the above medications, will continue these as prescribed.

## 2023-06-10 ENCOUNTER — Other Ambulatory Visit: Payer: Self-pay | Admitting: Family Medicine

## 2023-06-10 DIAGNOSIS — I10 Essential (primary) hypertension: Secondary | ICD-10-CM

## 2023-06-17 ENCOUNTER — Other Ambulatory Visit: Payer: Self-pay | Admitting: Family Medicine

## 2023-06-17 DIAGNOSIS — I10 Essential (primary) hypertension: Secondary | ICD-10-CM

## 2023-07-06 LAB — HM MAMMOGRAPHY

## 2023-07-08 ENCOUNTER — Ambulatory Visit: Payer: Self-pay | Admitting: Family Medicine

## 2023-07-08 ENCOUNTER — Encounter: Payer: Self-pay | Admitting: Family Medicine

## 2023-07-16 ENCOUNTER — Encounter: Admitting: Family Medicine

## 2023-07-30 ENCOUNTER — Encounter: Admitting: Family Medicine

## 2023-09-03 ENCOUNTER — Encounter: Payer: Self-pay | Admitting: Family Medicine

## 2023-09-03 ENCOUNTER — Ambulatory Visit (INDEPENDENT_AMBULATORY_CARE_PROVIDER_SITE_OTHER): Admitting: Family Medicine

## 2023-09-03 VITALS — BP 116/80 | HR 75 | Temp 98.1°F | Ht 67.75 in | Wt 207.2 lb

## 2023-09-03 DIAGNOSIS — Z1211 Encounter for screening for malignant neoplasm of colon: Secondary | ICD-10-CM

## 2023-09-03 DIAGNOSIS — Z1159 Encounter for screening for other viral diseases: Secondary | ICD-10-CM

## 2023-09-03 DIAGNOSIS — Z0000 Encounter for general adult medical examination without abnormal findings: Secondary | ICD-10-CM

## 2023-09-03 DIAGNOSIS — I10 Essential (primary) hypertension: Secondary | ICD-10-CM

## 2023-09-03 DIAGNOSIS — L2082 Flexural eczema: Secondary | ICD-10-CM

## 2023-09-03 DIAGNOSIS — Z13220 Encounter for screening for lipoid disorders: Secondary | ICD-10-CM

## 2023-09-03 DIAGNOSIS — Z114 Encounter for screening for human immunodeficiency virus [HIV]: Secondary | ICD-10-CM

## 2023-09-03 DIAGNOSIS — Z23 Encounter for immunization: Secondary | ICD-10-CM

## 2023-09-03 LAB — LIPID PANEL
Cholesterol: 216 mg/dL — ABNORMAL HIGH (ref 0–200)
HDL: 69.8 mg/dL (ref >39.00)
LDL Cholesterol: 128 mg/dL — ABNORMAL HIGH (ref 0–99)
NonHDL: 146.46
Total CHOL/HDL Ratio: 3
Triglycerides: 93 mg/dL (ref 0.0–149.0)
VLDL: 18.6 mg/dL (ref 0.0–40.0)

## 2023-09-03 LAB — COMPREHENSIVE METABOLIC PANEL WITH GFR
ALT: 33 U/L (ref 0–35)
AST: 32 U/L (ref 0–37)
Albumin: 4.7 g/dL (ref 3.5–5.2)
Alkaline Phosphatase: 76 U/L (ref 39–117)
BUN: 12 mg/dL (ref 6–23)
CO2: 28 meq/L (ref 19–32)
Calcium: 9.9 mg/dL (ref 8.4–10.5)
Chloride: 102 meq/L (ref 96–112)
Creatinine, Ser: 0.86 mg/dL (ref 0.40–1.20)
GFR: 73.22 mL/min (ref >60.00)
Glucose, Bld: 95 mg/dL (ref 70–99)
Potassium: 3.7 meq/L (ref 3.5–5.1)
Sodium: 139 meq/L (ref 135–145)
Total Bilirubin: 0.5 mg/dL (ref 0.2–1.2)
Total Protein: 8 g/dL (ref 6.0–8.3)

## 2023-09-03 MED ORDER — AMLODIPINE BESYLATE 10 MG PO TABS: ORAL_TABLET | ORAL | 1 refills | Status: AC

## 2023-09-03 MED ORDER — LISINOPRIL-HYDROCHLOROTHIAZIDE 20-25 MG PO TABS: 1.0000 | ORAL_TABLET | Freq: Every day | ORAL | 1 refills | Status: AC

## 2023-09-03 MED ORDER — CLOBETASOL PROPIONATE 0.05 % EX OINT: 1.0000 | TOPICAL_OINTMENT | Freq: Two times a day (BID) | CUTANEOUS | 2 refills | Status: AC

## 2023-09-03 NOTE — Patient Instructions (Signed)

## 2023-09-03 NOTE — Progress Notes (Signed)
 Complete physical exam  Patient: Alexandra Moyer   DOB: 09-13-1960   63 y.o. Female  MRN: [id number]  Subjective:    Chief Complaint  Patient presents with   Annual Exam    Alexandra Moyer is a 63 y.o. female who presents today for a complete physical exam. She reports consuming a general diet, eats good sources of protein, does eat fruits and veggies, eats fast food about 4-5 times per week.  Home exercise routine includes walking 1-2  hrs per week. She generally feels well. She reports sleeping well. She does not have additional problems to discuss today.    Most recent fall risk assessment:    02/12/2023    3:16 PM  Fall Risk   Falls in the past year? 0  Number falls in past yr: 0  Injury with Fall? 0  Risk for fall due to : Other (Comment)  Follow up Falls evaluation completed     Most recent depression screenings:    02/12/2023    3:16 PM 06/26/2021    3:40 PM  PHQ 2/9 Scores  PHQ - 2 Score 0 0  PHQ- 9 Score  2    Vision:Within last year and Dental: No current dental problems and Receives regular dental care  Patient Active Problem List   Diagnosis Date Noted   Chronic pain of left knee 06/26/2021   Eczema 06/26/2021   Primary hypertension 06/26/2021      Patient Care Team: Ozell Heron HERO, MD as PCP - General (Family Medicine)   Outpatient Medications Prior to Visit  Medication Sig   triamcinolone  ointment (KENALOG ) 0.1 % Apply 1 application  topically 2 (two) times daily.   [DISCONTINUED] amLODipine  (NORVASC ) 10 MG tablet TAKE 1 TABLET(10 MG) BY MOUTH DAILY   [DISCONTINUED] celecoxib  (CELEBREX ) 200 MG capsule Take 1 capsule (200 mg total) by mouth 2 (two) times daily.   [DISCONTINUED] clobetasol  ointment (TEMOVATE ) 0.05 % Apply 1 Application topically 2 (two) times daily.   [DISCONTINUED] fluticasone  (FLONASE ) 50 MCG/ACT nasal spray Place 1 spray into both nostrils 2 (two) times daily.   [DISCONTINUED] lisinopril -hydrochlorothiazide  (ZESTORETIC ) 20-25 MG  tablet TAKE 1 TABLET BY MOUTH DAILY   No facility-administered medications prior to visit.    Review of Systems  HENT:  Negative for hearing loss.   Eyes:  Negative for blurred vision.  Respiratory:  Negative for shortness of breath.   Cardiovascular:  Negative for chest pain.  Gastrointestinal: Negative.   Genitourinary: Negative.   Musculoskeletal:  Negative for back pain.  Neurological:  Negative for headaches.  Psychiatric/Behavioral:  Negative for depression.        Objective:     BP 116/80   Pulse 75   Temp 98.1 F (36.7 C) (Oral)   Ht 5' 7.75 (1.721 m)   Wt 207 lb 3.2 oz (94 kg)   LMP 01/30/2011   SpO2 100%   BMI 31.74 kg/m    Physical Exam Vitals reviewed.  Constitutional:      Appearance: Normal appearance. She is well-groomed. She is obese.  HENT:     Right Ear: Tympanic membrane and ear canal normal.     Left Ear: Tympanic membrane and ear canal normal.     Mouth/Throat:     Mouth: Mucous membranes are moist.     Pharynx: No posterior oropharyngeal erythema.  Eyes:     Conjunctiva/sclera: Conjunctivae normal.  Neck:     Thyroid: No thyromegaly.  Cardiovascular:     Rate and Rhythm: Normal  rate and regular rhythm.     Pulses: Normal pulses.     Heart sounds: S1 normal and S2 normal.  Pulmonary:     Effort: Pulmonary effort is normal.     Breath sounds: Normal breath sounds and air entry.  Abdominal:     General: Abdomen is flat. Bowel sounds are normal.     Palpations: Abdomen is soft.  Musculoskeletal:     Right lower leg: No edema.     Left lower leg: No edema.  Lymphadenopathy:     Cervical: No cervical adenopathy.  Neurological:     Mental Status: She is alert and oriented to person, place, and time. Mental status is at baseline.     Gait: Gait is intact.  Psychiatric:        Mood and Affect: Mood and affect normal.        Speech: Speech normal.        Behavior: Behavior normal.        Judgment: Judgment normal.      No results  found for any visits on 09/03/23.     Assessment & Plan:    Routine Health Maintenance and Physical Exam  Immunization History  Administered Date(s) Administered   Influenza-Unspecified 10/19/2021   Tdap 06/25/2020   Zoster Recombinant(Shingrix ) 06/25/2021, 09/03/2023    Health Maintenance  Topic Date Due   HIV Screening  Never done   Hepatitis C Screening  Never done   Pneumococcal Vaccine: 50+ Years (1 of 1 - PCV) Never done   COVID-19 Vaccine (1 - 2024-25 season) Never done   Colonoscopy  02/21/2023   INFLUENZA VACCINE  08/20/2023   MAMMOGRAM  07/05/2025   DTaP/Tdap/Td (2 - Td or Tdap) 06/26/2030   Zoster Vaccines- Shingrix   Completed   Hepatitis B Vaccines 19-59 Average Risk  Aged Out   HPV VACCINES  Aged Out   Meningococcal B Vaccine  Aged Out    Discussed health benefits of physical activity, and encouraged her to engage in regular exercise appropriate for her age and condition.  Lipid screening -     Lipid panel; Future  Primary hypertension -     amLODIPine  Besylate; TAKE 1 TABLET(10 MG) BY MOUTH DAILY  Dispense: 90 tablet; Refill: 1 -     Lisinopril -hydroCHLOROthiazide ; Take 1 tablet by mouth daily.  Dispense: 90 tablet; Refill: 1 -     Comprehensive metabolic panel with GFR; Future  Flexural eczema -     Clobetasol  Propionate; Apply 1 Application topically 2 (two) times daily.  Dispense: 60 g; Refill: 2  Colon cancer screening -     Ambulatory referral to Gastroenterology  Immunization due -     Varicella-zoster vaccine IM  Encounter for screening for HIV -     HIV Antibody (routine testing w rflx); Future  Need for hepatitis C screening test -     Hepatitis C antibody; Future  Routine general medical examination at a health care facility  Normal physical exam findings. I counseled the patient on the recommended amount of exercise per CDC recommendation. I reviewed preventative screening, immunizations, and medical history and updated in the chart,  and appropriate labs and vaccinations were ordered. Handouts given on healthy eating and exercise.     Return in 6 months (on 03/05/2024) for HTN.     Heron CHRISTELLA Sharper, MD

## 2023-09-04 LAB — HIV ANTIBODY (ROUTINE TESTING W REFLEX): HIV 1&2 Ab, 4th Generation: NONREACTIVE

## 2023-09-04 LAB — HEPATITIS C ANTIBODY: Hepatitis C Ab: NONREACTIVE

## 2023-09-06 ENCOUNTER — Ambulatory Visit: Payer: Self-pay | Admitting: Family Medicine

## 2023-12-01 ENCOUNTER — Encounter: Payer: Self-pay | Admitting: Internal Medicine
